# Patient Record
Sex: Male | Born: 1960
Health system: Southern US, Community
[De-identification: ages and names within clinical notes are randomized; demographics above are authoritative.]

## PROBLEM LIST (undated history)

## (undated) DIAGNOSIS — M5030 Other cervical disc degeneration, unspecified cervical region: Secondary | ICD-10-CM

## (undated) DIAGNOSIS — G2581 Restless legs syndrome: Secondary | ICD-10-CM

## (undated) DIAGNOSIS — E785 Hyperlipidemia, unspecified: Secondary | ICD-10-CM

## (undated) DIAGNOSIS — L719 Rosacea, unspecified: Secondary | ICD-10-CM

## (undated) DIAGNOSIS — G35 Multiple sclerosis: Secondary | ICD-10-CM

## (undated) HISTORY — DX: Rosacea, unspecified: L71.9

## (undated) HISTORY — DX: Hyperlipidemia, unspecified: E78.5

## (undated) HISTORY — DX: Multiple sclerosis: G35

## (undated) HISTORY — DX: Other cervical disc degeneration, unspecified cervical region: M50.30

## (undated) HISTORY — DX: Restless legs syndrome: G25.81

## (undated) HISTORY — PX: NO PAST SURGERIES: SHX2092

---

## 2014-11-17 ENCOUNTER — Telehealth: Payer: Self-pay

## 2014-11-17 NOTE — Telephone Encounter (Signed)
Rec'd from [facility] forward 6 pages to Historical Provider

## 2014-11-19 ENCOUNTER — Telehealth: Payer: Self-pay | Admitting: Neurology

## 2014-12-30 ENCOUNTER — Encounter: Payer: Self-pay | Admitting: *Deleted

## 2015-01-12 ENCOUNTER — Telehealth: Payer: Self-pay

## 2015-01-12 NOTE — Telephone Encounter (Signed)
LM for patient to call me regarding notes from previous Neurologist. Thanks

## 2015-01-17 NOTE — Telephone Encounter (Signed)
Number disconnected.

## 2015-02-18 ENCOUNTER — Telehealth: Payer: Self-pay

## 2015-02-18 NOTE — Telephone Encounter (Signed)
Encounter to update history

## 2015-02-21 ENCOUNTER — Other Ambulatory Visit: Payer: BLUE CROSS/BLUE SHIELD

## 2015-02-21 ENCOUNTER — Ambulatory Visit (INDEPENDENT_AMBULATORY_CARE_PROVIDER_SITE_OTHER): Payer: BLUE CROSS/BLUE SHIELD | Admitting: Neurology

## 2015-02-21 ENCOUNTER — Encounter: Payer: Self-pay | Admitting: Neurology

## 2015-02-21 ENCOUNTER — Other Ambulatory Visit: Payer: Self-pay

## 2015-02-21 VITALS — BP 110/64 | HR 66 | Wt 170.2 lb

## 2015-02-21 DIAGNOSIS — G35 Multiple sclerosis: Secondary | ICD-10-CM

## 2015-02-21 DIAGNOSIS — G43109 Migraine with aura, not intractable, without status migrainosus: Secondary | ICD-10-CM

## 2015-02-21 LAB — VITAMIN D 25 HYDROXY (VIT D DEFICIENCY, FRACTURES): VITD: 59.94 ng/mL (ref 30.00–100.00)

## 2015-02-21 MED ORDER — RIZATRIPTAN BENZOATE 10 MG PO TBDP: ORAL_TABLET | ORAL | Status: DC

## 2015-02-21 NOTE — Patient Instructions (Signed)
1.  Take rizatriptan (Maxalt) 10mg  at earliest onset of headache.  May repeat once in 2 hours if needed.  Do not exceed 2 tablets in 24 hours.  If ineffective, take first dose of rizatriptan with naproxen 500mg .   2.  Will get MRI of brain, cervical and thoracic spine with and without contrast 3.  Follow up in 3 months.

## 2015-02-21 NOTE — Progress Notes (Signed)
Chart sent.  

## 2015-02-21 NOTE — Addendum Note (Signed)
Addended by: Sheilah Mins A on: 02/21/2015 03:44 PM   Modules accepted: Orders

## 2015-02-21 NOTE — Progress Notes (Signed)
NEUROLOGY CONSULTATION NOTE  Christopher Vance MRN: [id number] DOB: 04/17/1961  Referring provider: Prudy Feeler, PA Primary care provider: Prudy Feeler, PA  Reason for consult:  MS  HISTORY OF PRESENT ILLNESS: Christopher Vance is a 54 year old right-handed male with depression and lumbar degenerative disc disease who presents for multiple sclerosis.  History obtained by patient and PCP notes.  MRI report from 2012 reviewed.  He was diagnosed with MS in 1998 after experiencing left sided numbness and paresthesias, fatigue and back pain.  MRI of the brain was abnormal.  CSF results of lumbar puncture were inconclusive.  He was previously on interferons, such as Betaseron and Avonex, as well as possibly Copaxone.  However, he did not tolerate any of them as they caused flu-like symptoms.  He has not been on a disease modifying agent for several years.  He has residual fatigue and left sided numbness.  On occasions, he would take prednisone when these symptoms would worsen.  Sometimes, he reports a shooting pain down the back of his neck, consistent with Lhermitte's sign.  On one occasion, he reported episode of left eye vision loss.  A few months ago, he had worsening of symptoms, as well as increased migraines, and was prescribed 25 days of prednisone, which helped.  He takes gabapentin  three times daily if needed for paresthesias.  He takes ropinirole for restless leg.  Brain MRI report from 12/20/10 showed multiple nonspecific non-enhancing hyperintense foci in the cerebral white matter.  There is no family history of MS.  He also has history of migraine.  They begin in back of head and travel to behind both eyes.  It is a squeezing pain.  It is associated with nausea, photophobia, phonophobia and visual disturbance (squiggly lines).  It lasts 2 days and occurs twice a month.  He takes Exedrin.  Imitrex has been ineffective.  PAST MEDICAL HISTORY: Past Medical History  Diagnosis Date    . Multiple sclerosis (HCC)   . Acne rosacea   . DDD (degenerative disc disease), cervical     PAST SURGICAL HISTORY: No past surgical history on file.  MEDICATIONS: Current Outpatient Prescriptions on File Prior to Visit  Medication Sig Dispense Refill  . clonazePAM (KLONOPIN) 1 MG tablet Take 1 mg by mouth 3 (three) times daily as needed for anxiety.    Marland Kitchen DOXYCYCLINE HYCLATE PO Take 100 mg by mouth every 12 (twelve) hours.    Marland Kitchen ibuprofen (ADVIL,MOTRIN) 800 MG tablet Take 800 mg by mouth every 8 (eight) hours as needed.    . lubiprostone (AMITIZA) 8 MCG capsule Take 8 mcg by mouth 2 (two) times daily with a meal.    . omega-3 acid ethyl esters (LOVAZA) 1 G capsule Take 2 g by mouth 2 (two) times daily.    Marland Kitchen rOPINIRole (REQUIP) 2 MG tablet Take 2 mg by mouth at bedtime.     No current facility-administered medications on file prior to visit.    ALLERGIES: Allergies not on file  FAMILY HISTORY: Family History  Problem Relation Age of Onset  . Hypertension Mother     SOCIAL HISTORY: Social History   Social History  . Marital Status: Married    Spouse Name: N/A  . Number of Children: N/A  . Years of Education: N/A   Occupational History  . Not on file.   Social History Main Topics  . Smoking status: Never Smoker   . Smokeless tobacco: Not on file  . Alcohol Use: No  .  Drug Use: Not on file  . Sexual Activity: Not on file   Other Topics Concern  . Not on file   Social History Narrative   Pt lives with his daughter, one story home. Completed 12th grade.     REVIEW OF SYSTEMS: Constitutional: No fevers, chills, or sweats, no generalized fatigue, change in appetite Eyes: No visual changes, double vision, eye pain Ear, nose and throat: No hearing loss, ear pain, nasal congestion, sore throat Cardiovascular: No chest pain, palpitations Respiratory:  No shortness of breath at rest or with exertion, wheezes GastrointestinaI: No nausea, vomiting, diarrhea, abdominal  pain, fecal incontinence Genitourinary:  No dysuria, urinary retention or frequency Musculoskeletal:  No neck pain, back pain Integumentary: No rash, pruritus, skin lesions Neurological: as above Psychiatric: No depression, insomnia, anxiety Endocrine: No palpitations, fatigue, diaphoresis, mood swings, change in appetite, change in weight, increased thirst Hematologic/Lymphatic:  No anemia, purpura, petechiae. Allergic/Immunologic: no itchy/runny eyes, nasal congestion, recent allergic reactions, rashes  PHYSICAL EXAM: Filed Vitals:   02/21/15 1439  BP: 110/64  Pulse: 66   General: No acute distress.  Patient appears well-groomed.  Head:  Normocephalic/atraumatic Eyes:  fundi unremarkable, without vessel changes, exudates, hemorrhages or papilledema. Neck: supple, no paraspinal tenderness, full range of motion Back: No paraspinal tenderness Heart: regular rate and rhythm Lungs: Clear to auscultation bilaterally. Vascular: No carotid bruits. Neurological Exam: Mental status: alert and oriented to person, place, and time, recent and remote memory intact, fund of knowledge intact, attention and concentration intact, speech fluent and not dysarthric, language intact. Cranial nerves: CN I: not tested CN II: pupils equal, round and reactive to light, visual fields intact, fundi unremarkable, without vessel changes, exudates, hemorrhages or papilledema. CN III, IV, VI:  full range of motion, no nystagmus, no ptosis CN V: facial sensation intact CN VII: upper and lower face symmetric CN VIII: hearing intact CN IX, X: gag intact, uvula midline CN XI: sternocleidomastoid and trapezius muscles intact CN XII: tongue midline Bulk & Tone: normal, no fasciculations. Motor:  5/5 throughout  Sensation:  Pinprick and vibration sensation intact. Deep Tendon Reflexes:  2+ throughout, toes downgoing.  Finger to nose testing:  Without dysmetria.  Heel to shin:  Without dysmetria.  Gait:  Normal  station and stride.  Able to turn and tandem walk. Timed 25 foot walk 4.90 seconds.  Romberg negative.  IMPRESSION: Multiple sclerosis Migraine with aura  PLAN: 1.  MRI of brain, cervical and thoracic spine with and without contrast 2.  Maxalt 10mg  with or without naproxen 500mg  for abortive therapy for migraine 3.  Check vitamin D level 4.  Follow up in 3 months.  Thank you for allowing me to take part in the care of this patient.  Shon Millet, DO  CC: Prudy Feeler, PA

## 2015-02-22 ENCOUNTER — Telehealth: Payer: Self-pay

## 2015-02-22 NOTE — Telephone Encounter (Signed)
Prior authorization for Rizatriptan ODT 10 mg tablets was started via [email or web address]. Key - [id number]

## 2015-02-22 NOTE — Telephone Encounter (Signed)
-----   Message from Adam R Jaffe, DO sent at 02/22/2015  7:09 AM EDT ----- Vitamin D level is okay. 

## 2015-02-22 NOTE — Telephone Encounter (Signed)
Left message on patients vm

## 2015-02-22 NOTE — Telephone Encounter (Signed)
-----   Message from Drema Dallas, DO sent at 02/22/2015  7:09 AM EDT ----- Vitamin D level is okay.

## 2015-02-24 NOTE — Telephone Encounter (Signed)
Received fax from [facility] stating medication did not need a PA for 9 tablets a month. States pharmacy was trying to run rx through at higher amount (possibly ran through system twice on accident). No further action needed.

## 2015-03-03 ENCOUNTER — Ambulatory Visit
Admission: RE | Admit: 2015-03-03 | Discharge: 2015-03-03 | Disposition: A | Discharge status: Home / Self Care | Payer: BLUE CROSS/BLUE SHIELD | Source: Ambulatory Visit | Attending: Neurology | Admitting: Neurology

## 2015-03-03 DIAGNOSIS — G35 Multiple sclerosis: Secondary | ICD-10-CM

## 2015-03-03 DIAGNOSIS — G43109 Migraine with aura, not intractable, without status migrainosus: Secondary | ICD-10-CM

## 2015-03-04 ENCOUNTER — Telehealth: Payer: Self-pay

## 2015-03-04 NOTE — Telephone Encounter (Signed)
Left message for pt to return call.

## 2015-03-04 NOTE — Telephone Encounter (Signed)
-----   Message from Drema Dallas, DO sent at 03/04/2015  7:10 AM EDT ----- MRI is stable.  It shows no active disease in the brain.  The spinal cord looks normal.

## 2015-03-04 NOTE — Telephone Encounter (Signed)
Spoke to patient. Message relayed.

## 2015-03-14 NOTE — Telephone Encounter (Signed)
Error

## 2015-06-03 ENCOUNTER — Encounter: Payer: Self-pay | Admitting: Neurology

## 2015-06-03 ENCOUNTER — Ambulatory Visit (INDEPENDENT_AMBULATORY_CARE_PROVIDER_SITE_OTHER): Payer: BLUE CROSS/BLUE SHIELD | Admitting: Neurology

## 2015-06-03 ENCOUNTER — Other Ambulatory Visit (INDEPENDENT_AMBULATORY_CARE_PROVIDER_SITE_OTHER): Payer: BLUE CROSS/BLUE SHIELD

## 2015-06-03 VITALS — BP 108/60 | HR 93 | Ht 70.0 in | Wt 170.0 lb

## 2015-06-03 DIAGNOSIS — R9082 White matter disease, unspecified: Secondary | ICD-10-CM | POA: Insufficient documentation

## 2015-06-03 DIAGNOSIS — M62838 Other muscle spasm: Secondary | ICD-10-CM | POA: Diagnosis not present

## 2015-06-03 DIAGNOSIS — R930 Abnormal findings on diagnostic imaging of skull and head, not elsewhere classified: Secondary | ICD-10-CM | POA: Diagnosis not present

## 2015-06-03 DIAGNOSIS — G43109 Migraine with aura, not intractable, without status migrainosus: Secondary | ICD-10-CM | POA: Diagnosis not present

## 2015-06-03 LAB — VITAMIN B12: Vitamin B-12: 417 pg/mL (ref 211–911)

## 2015-06-03 LAB — SEDIMENTATION RATE: Sed Rate: 16 mm/hr (ref 0–22)

## 2015-06-03 LAB — CK: CK TOTAL: 96 U/L (ref 7–232)

## 2015-06-03 LAB — RHEUMATOID FACTOR

## 2015-06-03 NOTE — Patient Instructions (Signed)
1.  Continue rizatriptan and naproxen for migraines 2.  If you would like a medication for muscle spasms, please call 3.  Will check CK, ANA, Sed Rate, RF, Sjogren's antibodies, ANCA, B12 4.  Repeat MRI of brain with and without contrast in 6 months and follow up soon after

## 2015-06-03 NOTE — Progress Notes (Signed)
Chart forwarded.  

## 2015-06-03 NOTE — Progress Notes (Signed)
NEUROLOGY FOLLOW UP OFFICE NOTE  Christopher Vance [id number]  HISTORY OF PRESENT ILLNESS: Christopher Vance is a 55 year old right-handed male with depression and lumbar degenerative disc disease who follows up for multiple sclerosis and migraine.  Labs and imaging of neuro-axis reviewed.  UPDATE: MRI of brain with and without contrast performed on 03/03/15 showed scattered subcortical non-enhancing T2 hyperintensities bilaterally.  MRI of the cervical and thoracic spine demonstrated no cord lesions.  Vitamin D level was 59.94.    For migraines, he takes Maxalt '10mg'$  with naproxen, which is effective. He reports muscle spasms.  HISTORY: He was diagnosed with MS in 1998 after experiencing left sided numbness and paresthesias, fatigue and back pain.  MRI of the brain was abnormal.  CSF results of lumbar puncture were inconclusive.  He was previously on interferons, such as Betaseron and Avonex, as well as possibly Copaxone.  However, he did not tolerate any of them as they caused flu-like symptoms.  He has not been on a disease modifying agent for several years.  He has residual fatigue and left sided numbness.  On occasions, he would take prednisone when these symptoms would worsen.  Sometimes, he reports a shooting pain down the back of his neck, consistent with Lhermitte's sign.  On one occasion, he reported episode of left eye vision loss.  A few months ago, he had worsening of symptoms, as well as increased migraines, and was prescribed 25 days of prednisone, which helped.  He takes gabapentin '100mg'$  three times daily if needed for paresthesias.  He takes ropinirole for restless leg.  Brain MRI report from 12/20/10 showed multiple nonspecific non-enhancing hyperintense foci in the cerebral white matter.  There is no family history of MS.  He also has history of migraine.  They begin in back of head and travel to behind both eyes.  It is a squeezing pain.  It is associated with nausea,  photophobia, phonophobia and visual disturbance (squiggly lines).  It lasts 2 days and occurs twice a month.  Exedrin somewhat effective.  Imitrex has been ineffective.  PAST MEDICAL HISTORY: Past Medical History  Diagnosis Date  . Multiple sclerosis ([place])   . Acne rosacea   . DDD (degenerative disc disease), cervical     MEDICATIONS: Current Outpatient Prescriptions on File Prior to Visit  Medication Sig Dispense Refill  . citalopram (CELEXA) 10 MG tablet   1  . clonazePAM (KLONOPIN) 1 MG tablet Take 1 mg by mouth 3 (three) times daily as needed for anxiety.    Marland Kitchen DOXYCYCLINE HYCLATE PO Take 100 mg by mouth every 12 (twelve) hours.    . gabapentin (NEURONTIN) 100 MG capsule   2  . ibuprofen (ADVIL,MOTRIN) 800 MG tablet Take 800 mg by mouth every 8 (eight) hours as needed.    Marland Kitchen LINZESS 290 MCG CAPS capsule   0  . omega-3 acid ethyl esters (LOVAZA) 1 G capsule Take 2 g by mouth 2 (two) times daily.    . rizatriptan (MAXALT-MLT) 10 MG disintegrating tablet Take 1tab at earliest onset of headache.  May repeat once in 2 hours if needed.  Do not exceed 2 tabs in 24 hours. 9 tablet 3  . rOPINIRole (REQUIP) 2 MG tablet Take 2 mg by mouth at bedtime.     No current facility-administered medications on file prior to visit.    ALLERGIES: No Known Allergies  FAMILY HISTORY: Family History  Problem Relation Age of Onset  . Hypertension Mother  SOCIAL HISTORY: Social History   Social History  . Marital Status: Married    Spouse Name: N/A  . Number of Children: N/A  . Years of Education: N/A   Occupational History  . Not on file.   Social History Main Topics  . Smoking status: Never Smoker   . Smokeless tobacco: Not on file  . Alcohol Use: No  . Drug Use: Not on file  . Sexual Activity: Not on file   Other Topics Concern  . Not on file   Social History Narrative   Pt lives with his daughter, one story home. Completed 12th grade.     REVIEW OF SYSTEMS: Constitutional:  No fevers, chills, or sweats, no generalized fatigue, change in appetite Eyes: No visual changes, double vision, eye pain Ear, nose and throat: No hearing loss, ear pain, nasal congestion, sore throat Cardiovascular: No chest pain, palpitations Respiratory:  No shortness of breath at rest or with exertion, wheezes GastrointestinaI: No nausea, vomiting, diarrhea, abdominal pain, fecal incontinence Genitourinary:  No dysuria, urinary retention or frequency Musculoskeletal:  No neck pain, back pain Integumentary: No rash, pruritus, skin lesions Neurological: as above Psychiatric: No depression, insomnia, anxiety Endocrine: No palpitations, fatigue, diaphoresis, mood swings, change in appetite, change in weight, increased thirst Hematologic/Lymphatic:  No anemia, purpura, petechiae. Allergic/Immunologic: no itchy/runny eyes, nasal congestion, recent allergic reactions, rashes  PHYSICAL EXAM: Filed Vitals:   06/03/15 0749  BP: 108/60  Pulse: 93   General: No acute distress.  Patient appears well-groomed.  normal body habitus. Head:  Normocephalic/atraumatic Eyes:  Fundoscopic exam unremarkable without vessel changes, exudates, hemorrhages or papilledema. Neck: supple, no paraspinal tenderness, full range of motion Heart:  Regular rate and rhythm Lungs:  Clear to auscultation bilaterally Back: No paraspinal tenderness Neurological Exam: alert and oriented to person, place, and time. Attention span and concentration intact, recent and remote memory intact, fund of knowledge intact.  Speech fluent and not dysarthric, language intact.  CN II-XII intact. Fundoscopic exam unremarkable without vessel changes, exudates, hemorrhages or papilledema.  Bulk and tone normal, muscle strength 5/5 throughout.  Sensation to vibration intact.  Reports mildly decreased pinprick sensation to left foot.  Deep tendon reflexes 2+ throughout, toes downgoing.  Finger to nose and heel to shin testing intact.  Gait  normal, Romberg negative.  IMPRESSION: Abnormal white matter on MRI of brain.  Nonspecific.  Without prior MRI imaging, I cannot assess if there is any progression, but it is not abundant.  At this point, he really doesn't fit criteria for MS. Migraine with aura Muscle spasms  PLAN: 1.  Since he has clinically been stable for several years, and diagnosis has not yet met criteria, we will hold off on starting a disease modifying agent and continue to monitor.  We will repeat MRI of brain with and without contrast in 6 months. 2.  Recommended baclofen for spasms, but he would like to hold off for now.  Will check CK 3.  Will check ANA, Sed Rate, RF, Sjogren's antibodies, ANCA, B12 4.  Maxalt '10mg'$  and naproxen for migraine abortive treatment 5.  Follow up in 6 months.  Metta Clines, DO  CC:  Donaciano Eva, PA-C

## 2015-06-06 ENCOUNTER — Telehealth: Payer: Self-pay

## 2015-06-06 LAB — ANA: Anti Nuclear Antibody(ANA): NEGATIVE

## 2015-06-06 LAB — ANCA SCREEN W REFLEX TITER: ANCA SCREEN: NEGATIVE

## 2015-06-06 LAB — SJOGREN'S SYNDROME ANTIBODS(SSA + SSB)
SSA (RO) (ENA) ANTIBODY, IGG: NEGATIVE
SSB (LA) (ENA) ANTIBODY, IGG: NEGATIVE

## 2015-06-06 NOTE — Telephone Encounter (Signed)
-----   Message from Drema Dallas, DO sent at 06/06/2015  1:26 PM EST ----- All labs are normal

## 2015-06-06 NOTE — Telephone Encounter (Signed)
Results left on machine.

## 2015-06-08 ENCOUNTER — Encounter: Payer: Self-pay | Admitting: Neurology

## 2015-06-10 ENCOUNTER — Encounter: Payer: Self-pay | Admitting: Physician Assistant

## 2015-07-22 ENCOUNTER — Telehealth: Payer: Self-pay | Admitting: Neurology

## 2015-07-22 NOTE — Telephone Encounter (Signed)
Patient called wanting to know if you all received his MRI results that were coming from [facility]. His name is Christopher Vance 2061/01/04. Thank you

## 2015-07-22 NOTE — Telephone Encounter (Signed)
Have not seen. Not in CareEverywhere. Will contact [facility] on Monday.

## 2015-10-14 DIAGNOSIS — H1132 Conjunctival hemorrhage, left eye: Secondary | ICD-10-CM | POA: Diagnosis not present

## 2015-12-01 ENCOUNTER — Telehealth: Payer: Self-pay | Admitting: Neurology

## 2015-12-02 ENCOUNTER — Ambulatory Visit: Payer: BLUE CROSS/BLUE SHIELD | Admitting: Neurology

## 2015-12-19 NOTE — Telephone Encounter (Signed)
Finished

## 2016-05-15 ENCOUNTER — Ambulatory Visit (INDEPENDENT_AMBULATORY_CARE_PROVIDER_SITE_OTHER): Payer: BLUE CROSS/BLUE SHIELD | Admitting: Physician Assistant

## 2016-05-15 ENCOUNTER — Encounter: Payer: Self-pay | Admitting: Physician Assistant

## 2016-05-15 VITALS — BP 114/73 | HR 54 | Temp 97.2°F | Ht 70.0 in | Wt 145.6 lb

## 2016-05-15 DIAGNOSIS — G35 Multiple sclerosis: Secondary | ICD-10-CM | POA: Diagnosis not present

## 2016-05-15 DIAGNOSIS — G43109 Migraine with aura, not intractable, without status migrainosus: Secondary | ICD-10-CM | POA: Diagnosis not present

## 2016-05-15 MED ORDER — ROPINIROLE HCL 2 MG PO TABS: 2.0000 mg | ORAL_TABLET | Freq: Every day | ORAL | 3 refills | Status: DC

## 2016-05-15 MED ORDER — RIZATRIPTAN BENZOATE 10 MG PO TBDP: ORAL_TABLET | ORAL | 3 refills | Status: DC

## 2016-05-15 NOTE — Patient Instructions (Signed)
Multiple Sclerosis °Multiple sclerosis (MS) is a disease of the central nervous system. It leads to the loss of the insulating covering of the nerves (myelin sheath) of your brain. When this happens, brain signals do not get sent properly or may not get sent at all. The age of onset of MS varies. °What are the causes? °The cause of MS is unknown. However, it is more common in the [place] than in the [place]. °What increases the risk? °There is a higher number of women with MS than men. MS is not an illness that is passed down to you from your family members (inherited). However, your risk of MS is higher if you have a relative with MS. °What are the signs or symptoms? °The symptoms of MS occur in episodes or attacks. These attacks may last weeks to months. There may be long periods of almost no symptoms between attacks. The symptoms of MS vary. This is because of the many different ways it affects the central nervous system. The main symptoms of MS include: °· Vision problems and eye pain. °· Numbness. °· Weakness. °· Inability to move your arms, hands, feet, or legs (paralysis). °· Balance problems. °· Tremors. °How is this diagnosed? °Your health care provider can diagnose MS with the help of imaging exams and lab tests. These may include specialized X-ray exams and spinal fluid tests. The best imaging exam to confirm a diagnosis of MS is an MRI. °How is this treated? °There is no known cure for MS, but there are medicines that can decrease the number and frequency of attacks. Steroids are often used for short-term relief. Physical and occupational therapy may also help. There are also many new alternative or complementary treatments available to help control the symptoms of MS. Ask your health care provider if any of these other options are right for you. °Follow these instructions at home: °· Take medicines as directed by your health care provider. °· Exercise as directed by your  health care provider. °Contact a health care provider if: °You begin to feel depressed. °Get help right away if: °· You develop paralysis. °· You have problems with bladder, bowel, or sexual function. °· You develop mental changes, such as forgetfulness or mood swings. °· You have a period of uncontrolled movements (seizure). °This information is not intended to replace advice given to you by your health care provider. Make sure you discuss any questions you have with your health care provider. °Document Released: 04/13/2000 Document Revised: 09/22/2015 Document Reviewed: 12/22/2012 °Elsevier Interactive Patient Education © 2017 [employer] ° °

## 2016-05-17 NOTE — Progress Notes (Signed)
BP 114/73   Pulse (!) 54   Temp 97.2 F (36.2 C) (Oral)   Ht 5\' 10"  (1.778 m)   Wt 145 lb 9.6 oz (66 kg)   BMI 20.89 kg/m    Subjective:    Patient ID: Christopher Vance, male    DOB: 02/12/1961, 56 y.o.   MRN: [id number]  HPI: Christopher Vance is a 56 y.o. male presenting on 05/15/2016 for Establish Care (new pt to practice - est w/ Prudy Feeler)  Patient here to be established as new patient at [facility].  This patient is known to me from [facility]. This patient comes in for periodic recheck on medications and conditions. All medications are reviewed today. There are no reports of any problems with the medications. All of the medical conditions are reviewed and updated.  Lab work is reviewed and will be ordered as medically necessary. There are no new problems reported with today's visit.   Past Medical History:  Diagnosis Date  . Acne rosacea   . DDD (degenerative disc disease), cervical   . Hyperlipidemia   . Multiple sclerosis (HCC)   . Multiple sclerosis (HCC)   . RLS (restless legs syndrome)    Relevant past medical, surgical, family and social history reviewed and updated as indicated. Interim medical history since our last visit reviewed. Allergies and medications reviewed and updated. DATA REVIEWED: CHART IN EPIC  Social History   Social History  . Marital status: Widowed    Spouse name: N/A  . Number of children: N/A  . Years of education: N/A   Occupational History  . Not on file.   Social History Main Topics  . Smoking status: Never Smoker  . Smokeless tobacco: Never Used  . Alcohol use No  . Drug use: No  . Sexual activity: Not on file   Other Topics Concern  . Not on file   Social History Narrative   Pt lives with his daughter, one story home. Completed 12th grade.     History reviewed. No pertinent surgical history.  Family History  Problem Relation Age of Onset  . Hypertension Mother     Review of  Systems  Constitutional: Negative.  Negative for appetite change and fatigue.  HENT: Negative.   Eyes: Negative.  Negative for pain and visual disturbance.  Respiratory: Negative.  Negative for cough, chest tightness, shortness of breath and wheezing.   Cardiovascular: Negative.  Negative for chest pain, palpitations and leg swelling.  Gastrointestinal: Negative.  Negative for abdominal pain, diarrhea, nausea and vomiting.  Endocrine: Negative.   Genitourinary: Negative.   Musculoskeletal: Negative.   Skin: Negative.  Negative for color change and rash.  Neurological: Negative.  Negative for weakness, numbness and headaches.  Psychiatric/Behavioral: Negative.     Allergies as of 05/15/2016      Reactions   Levofloxacin Nausea And Vomiting      Medication List       Accurate as of 05/15/16 11:59 PM. Always use your most recent med list.          clonazePAM 1 MG tablet Commonly known as:  KLONOPIN Take 1 mg by mouth 3 (three) times daily as needed for anxiety.   LINZESS 290 MCG Caps capsule Generic drug:  linaclotide   rizatriptan 10 MG disintegrating tablet Commonly known as:  MAXALT-MLT Take 1tab at earliest onset of headache.  May repeat once in 2 hours if needed.  Do not exceed 2 tabs in 24  hours.   rOPINIRole 2 MG tablet Commonly known as:  REQUIP Take 1 tablet (2 mg total) by mouth at bedtime.          Objective:    BP 114/73   Pulse (!) 54   Temp 97.2 F (36.2 C) (Oral)   Ht 5\' 10"  (1.778 m)   Wt 145 lb 9.6 oz (66 kg)   BMI 20.89 kg/m   Allergies  Allergen Reactions  . Levofloxacin Nausea And Vomiting    Wt Readings from Last 3 Encounters:  05/15/16 145 lb 9.6 oz (66 kg)  06/03/15 170 lb (77.1 kg)  03/03/15 165 lb (74.8 kg)    Physical Exam  Constitutional: He appears well-developed and well-nourished.  HENT:  Head: Normocephalic and atraumatic.  Eyes: Conjunctivae and EOM are normal. Pupils are equal, round, and reactive to light.  Neck:  Normal range of motion. Neck supple.  Cardiovascular: Normal rate, regular rhythm and normal heart sounds.   Pulmonary/Chest: Effort normal and breath sounds normal.  Abdominal: Soft. Bowel sounds are normal.  Musculoskeletal: Normal range of motion.  Skin: Skin is warm and dry.    Results for orders placed or performed in visit on 06/03/15  CK  Result Value Ref Range   Total CK 96 7 - 232 U/L  ANA  Result Value Ref Range   Anit Nuclear Antibody(ANA) NEG NEGATIVE  Sedimentation rate  Result Value Ref Range   Sed Rate 16 0 - 22 mm/hr  Rheumatoid factor  Result Value Ref Range   Rhuematoid fact SerPl-aCnc <10 <=14 IU/mL  Sjogren's syndrome antibods(ssa + ssb)  Result Value Ref Range   SSA ([place]) (ENA) Antibody, IgG <1.0 NEG <1.0 NEG AI   SSB ([place]) (ENA) Antibody, IgG <1.0 NEG <1.0 NEG AI  ANCA screen with reflex titer  Result Value Ref Range   ANCA Screen Negative   B12  Result Value Ref Range   Vitamin B-12 417 211 - 911 pg/mL      Assessment & Plan:   1. Relapsing remitting multiple sclerosis (HCC) - rOPINIRole (REQUIP) 2 MG tablet; Take 1 tablet (2 mg total) by mouth at bedtime.  Dispense: 180 tablet; Refill: 3  2. Migraine with aura and without status migrainosus, not intractable - rizatriptan (MAXALT-MLT) 10 MG disintegrating tablet; Take 1tab at earliest onset of headache.  May repeat once in 2 hours if needed.  Do not exceed 2 tabs in 24 hours.  Dispense: 27 tablet; Refill: 3   Continue all other maintenance medications as listed above.  Follow up plan: Return if symptoms worsen or fail to improve.  Educational handout given for multiple sclerosis  Remus Loffler PA-C [facility] [address]  [place], [place] [address] [phone]   05/17/2016, 8:21 PM

## 2016-07-03 IMAGING — MR MR THORACIC SPINE W/O CM
4 of 6 series · 19 of 48 positions shown · non-contrast
Comparison: MRI of the cervical spine from the same day.

CLINICAL DATA: Relapsing remitting multiple sclerosis. New onset of
weakness and migraines.

EXAM:
MRI THORACIC SPINE WITHOUT CONTRAST
TECHNIQUE: Multiplanar, multisequence MR imaging of the thoracic spine was
performed. No intravenous contrast was administered.

[Series 4: T1 · sagittal · 3.0mm · 0.67mm/px · 3 of 15 slices shown]
[im 3/15]
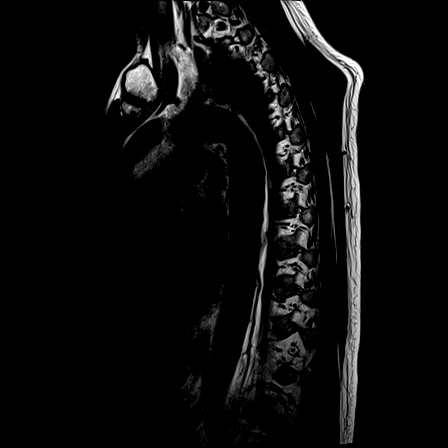
[im 9/15]
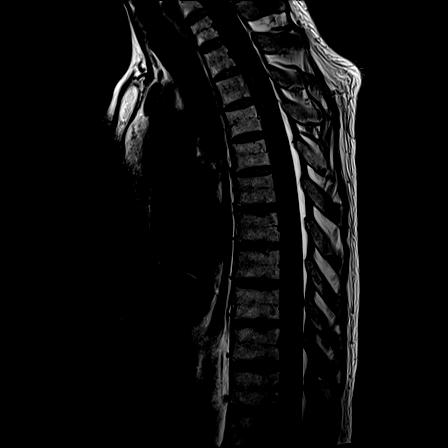
[im 15/15]
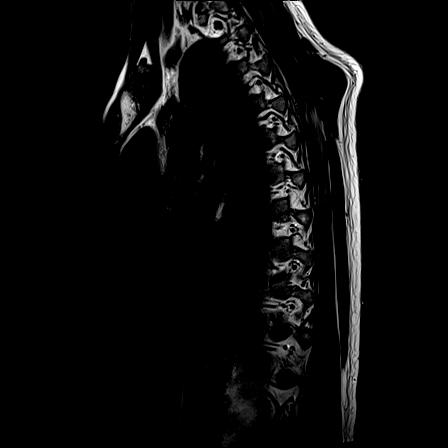

[Series 5: STIR · sagittal · 3.0mm · 0.67mm/px · 3 of 15 slices shown]
[im 3/15]
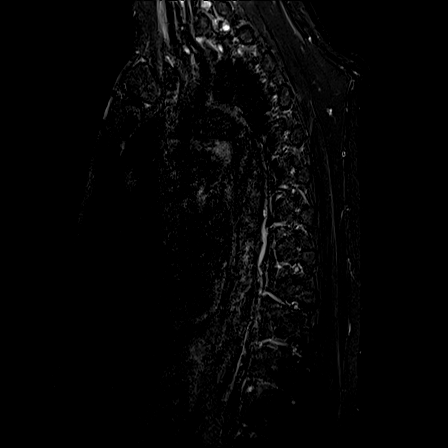
[im 9/15]
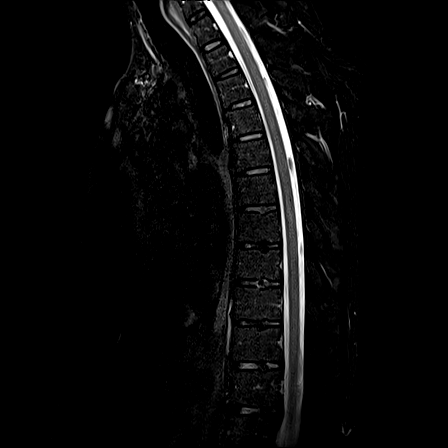
[im 15/15]
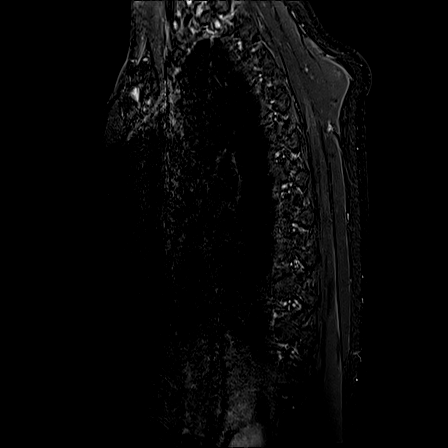

[Series 6: T2 · sagittal · 3.0mm · 0.67mm/px · 6 of 15 slices shown (1 of 2)]
[im 1/15]
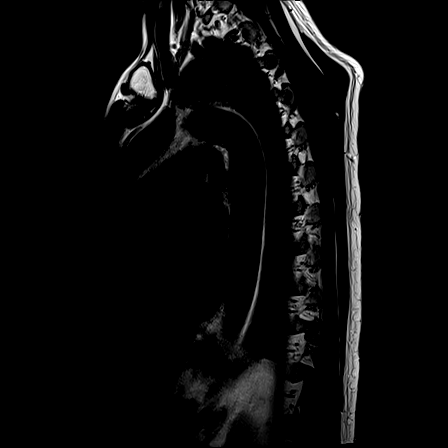
[im 3/15]
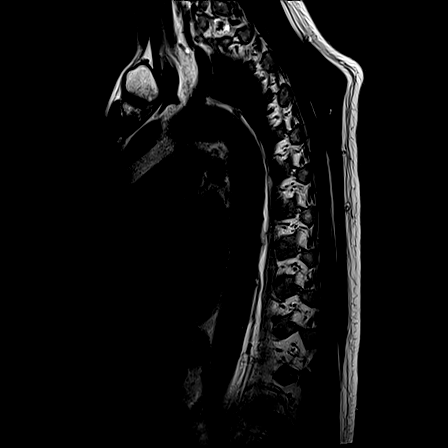
[im 6/15]
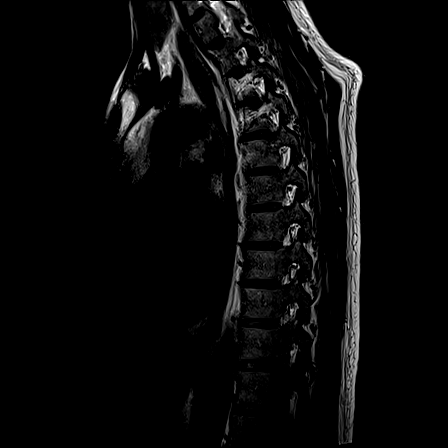
[im 9/15]
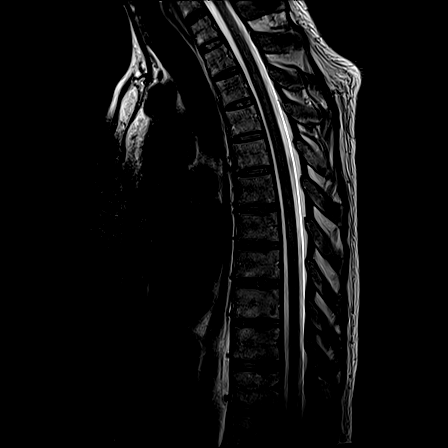
[im 12/15]
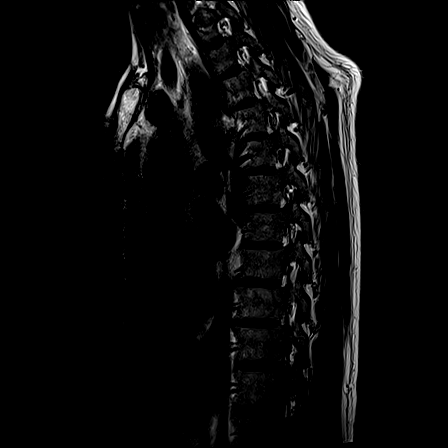
[im 15/15]
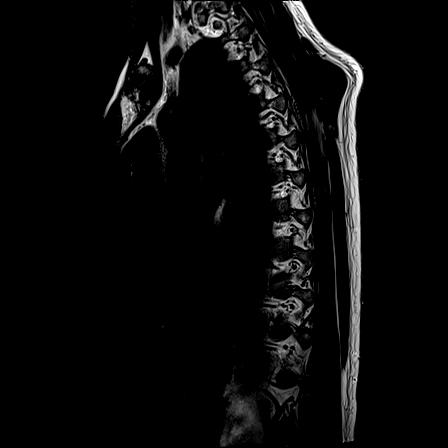

[Series 7: T2 · axial · 4.0mm · 0.28mm/px · z∈[-303,-121]mm · 7 of 33 slices shown (2 of 2)]
[im 1/33]
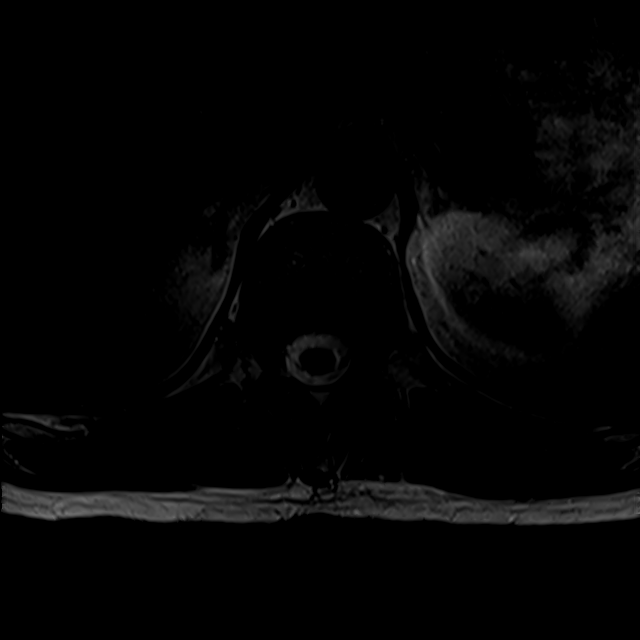
[im 6/33]
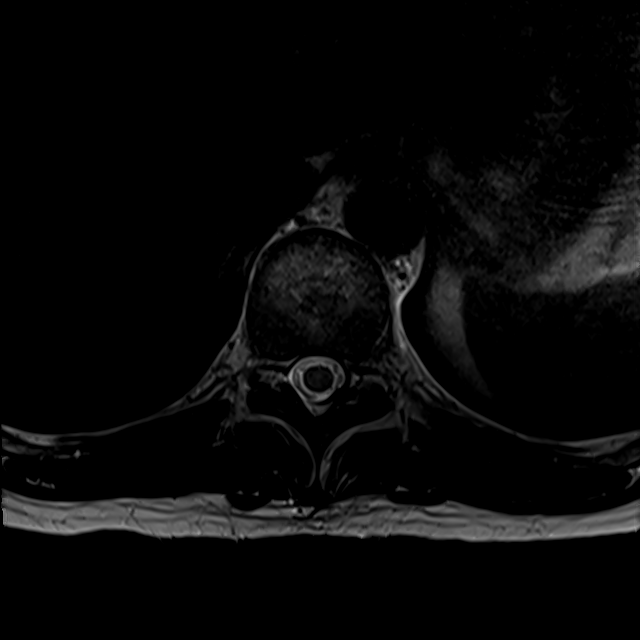
[im 11/33]
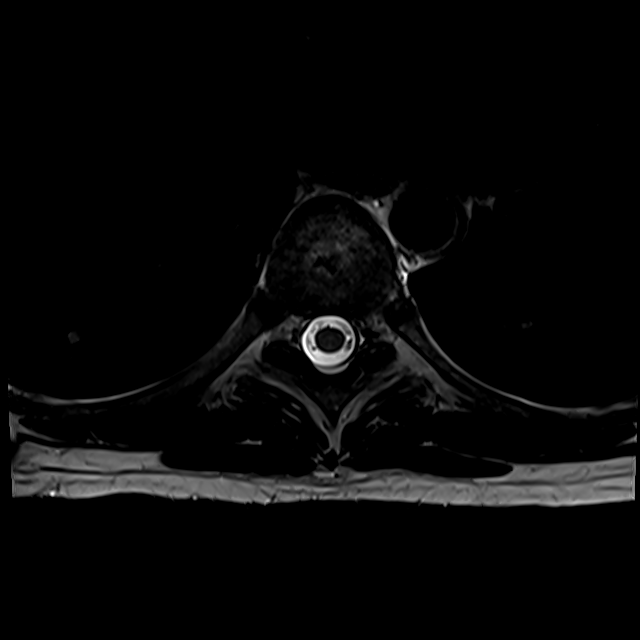
[im 14/33]
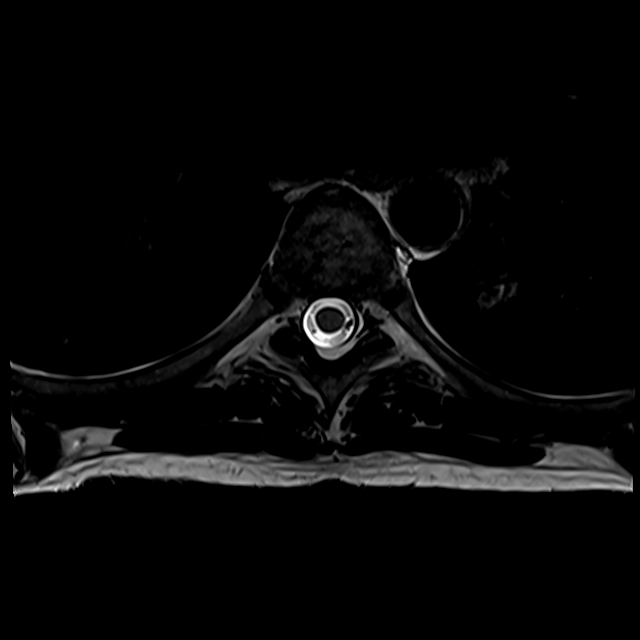
[im 17/33]
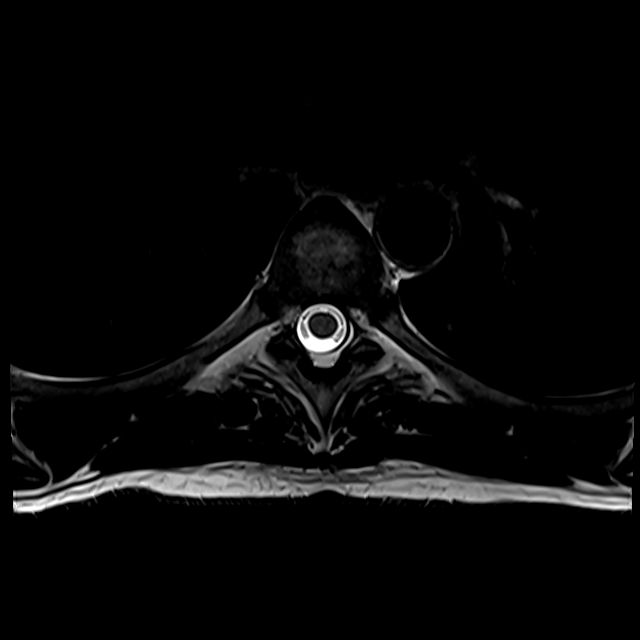
[im 19/33]
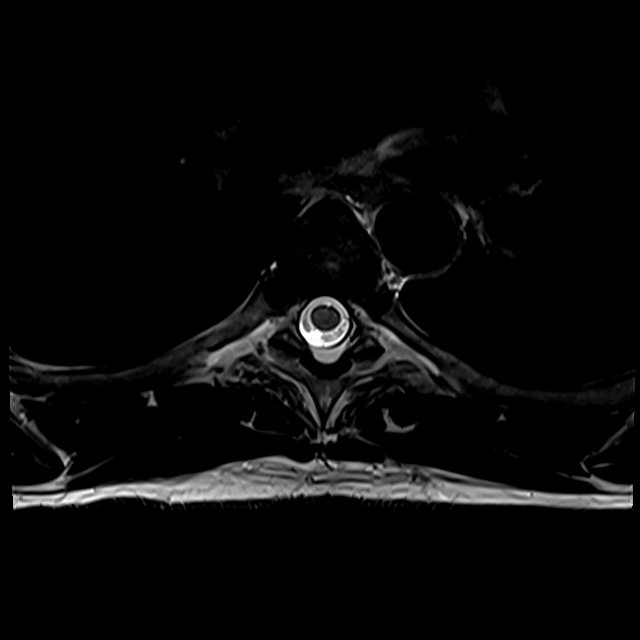
[im 27/33]
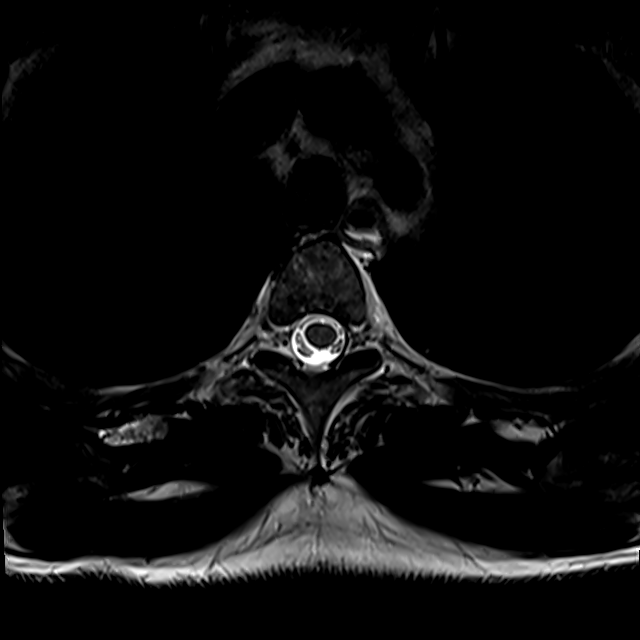

[19 of 48 positions shown; findings below may reference images not displayed]

FINDINGS: Normal signal is present throughout the thoracic spinal cord to the
lowest imaged level T12. The conus medullaris is not visualized.

Schmorl's nodes are evident from T6-7 through T11-12 without
exaggerated kyphosis or significant endplate marrow change.
Vertebral body heights and alignment are maintained.

No significant focal disc protrusion or stenosis is present. The
foramina are patent bilaterally.
IMPRESSION: 1. Normal signal throughout the spinal cord without evidence for
demyelination.
2. Schmorl's nodes at multiple levels without exaggerated kyphosis
or abnormal endplate signal.
3. No significant focal disc protrusion or stenosis.

## 2016-07-13 ENCOUNTER — Other Ambulatory Visit: Payer: Self-pay

## 2016-07-13 MED ORDER — IBUPROFEN 800 MG PO TABS
800.0000 mg | ORAL_TABLET | Freq: Three times a day (TID) | ORAL | 4 refills | Status: DC | PRN
Start: 2016-07-13 — End: 2017-06-01

## 2016-07-24 ENCOUNTER — Ambulatory Visit (INDEPENDENT_AMBULATORY_CARE_PROVIDER_SITE_OTHER): Payer: BLUE CROSS/BLUE SHIELD | Admitting: Family Medicine

## 2016-07-24 ENCOUNTER — Encounter: Payer: Self-pay | Admitting: Family Medicine

## 2016-07-24 VITALS — BP 103/64 | HR 58 | Temp 96.6°F | Ht 70.0 in | Wt 142.8 lb

## 2016-07-24 DIAGNOSIS — Z0000 Encounter for general adult medical examination without abnormal findings: Secondary | ICD-10-CM

## 2016-07-24 NOTE — Patient Instructions (Signed)
Great to meet you!  Come back in a year for an annual physical unless you need [place] sooner.    Health Maintenance, Male A healthy lifestyle and preventive care is important for your health and wellness. Ask your health care provider about what schedule of regular examinations is right for you. What should I know about weight and diet?  Eat a Healthy Diet  Eat plenty of vegetables, fruits, whole grains, low-fat dairy products, and lean protein.  Do not eat a lot of foods high in solid fats, added sugars, or salt. Maintain a Healthy Weight  Regular exercise can help you achieve or maintain a healthy weight. You should:  Do at least 150 minutes of exercise each week. The exercise should increase your heart rate and make you sweat (moderate-intensity exercise).  Do strength-training exercises at least twice a week. Watch Your Levels of Cholesterol and Blood Lipids  Have your blood tested for lipids and cholesterol every 5 years starting at 54 years of age. If you are at high risk for heart disease, you should start having your blood tested when you are 55 years old. You may need to have your cholesterol levels checked more often if:  Your lipid or cholesterol levels are high.  You are older than 55 years of age.  You are at high risk for heart disease. What should I know about cancer screening? Many types of cancers can be detected early and may often be prevented. Lung Cancer  You should be screened every year for lung cancer if:  You are a current smoker who has smoked for at least 30 years.  You are a former smoker who has quit within the past 15 years.  Talk to your health care provider about your screening options, when you should start screening, and how often you should be screened. Colorectal Cancer  Routine colorectal cancer screening usually begins at 55 years of age and should be repeated every 5-10 years until you are 55 years old. You may need to be screened more often  if early forms of precancerous polyps or small growths are found. Your health care provider may recommend screening at an earlier age if you have risk factors for colon cancer.  Your health care provider may recommend using home test kits to check for hidden blood in the stool.  A small camera at the end of a tube can be used to examine your colon (sigmoidoscopy or colonoscopy). This checks for the earliest forms of colorectal cancer. Prostate and Testicular Cancer  Depending on your age and overall health, your health care provider may do certain tests to screen for prostate and testicular cancer.  Talk to your health care provider about any symptoms or concerns you have about testicular or prostate cancer. Skin Cancer  Check your skin from head to toe regularly.  Tell your health care provider about any new moles or changes in moles, especially if:  There is a change in a mole's size, shape, or color.  You have a mole that is larger than a pencil eraser.  Always use sunscreen. Apply sunscreen liberally and repeat throughout the day.  Protect yourself by wearing long sleeves, pants, a wide-brimmed hat, and sunglasses when outside. What should I know about heart disease, diabetes, and high blood pressure?  If you are 5-34 years of age, have your blood pressure checked every 3-5 years. If you are 84 years of age or older, have your blood pressure checked every year. You should  have your blood pressure measured twice-once when you are at a hospital or clinic, and once when you are not at a hospital or clinic. Record the average of the two measurements. To check your blood pressure when you are not at a hospital or clinic, you can use:  An automated blood pressure machine at a pharmacy.  A home blood pressure monitor.  Talk to your health care provider about your target blood pressure.  If you are between 65-60 years old, ask your health care provider if you should take aspirin to  prevent heart disease.  Have regular diabetes screenings by checking your fasting blood sugar level.  If you are at a normal weight and have a low risk for diabetes, have this test once every three years after the age of 62.  If you are overweight and have a high risk for diabetes, consider being tested at a younger age or more often.  A one-time screening for abdominal aortic aneurysm (AAA) by ultrasound is recommended for men aged 65-75 years who are current or former smokers. What should I know about preventing infection? Hepatitis B  If you have a higher risk for hepatitis B, you should be screened for this virus. Talk with your health care provider to find out if you are at risk for hepatitis B infection. Hepatitis C  Blood testing is recommended for:  Everyone born from [birth year] through 1964.  Anyone with known risk factors for hepatitis C. Sexually Transmitted Diseases (STDs)  You should be screened each year for STDs including gonorrhea and chlamydia if:  You are sexually active and are younger than 55 years of age.  You are older than 55 years of age and your health care provider tells you that you are at risk for this type of infection.  Your sexual activity has changed since you were last screened and you are at an increased risk for chlamydia or gonorrhea. Ask your health care provider if you are at risk.  Talk with your health care provider about whether you are at high risk of being infected with HIV. Your health care provider may recommend a prescription medicine to help prevent HIV infection. What else can I do?  Schedule regular health, dental, and eye exams.  Stay current with your vaccines (immunizations).  Do not use any tobacco products, such as cigarettes, chewing tobacco, and e-cigarettes. If you need help quitting, ask your health care provider.  Limit alcohol intake to no more than 2 drinks per day. One drink equals 12 ounces of beer, 5 ounces of wine, or 1  ounces of hard liquor.  Do not use street drugs.  Do not share needles.  Ask your health care provider for help if you need support or information about quitting drugs.  Tell your health care provider if you often feel depressed.  Tell your health care provider if you have ever been abused or do not feel safe at home. This information is not intended to replace advice given to you by your health care provider. Make sure you discuss any questions you have with your health care provider. Document Released: 10/13/2007 Document Revised: 12/14/2015 Document Reviewed: 01/18/2015 Elsevier Interactive Patient Education  2017 [employer].

## 2016-07-24 NOTE — Progress Notes (Signed)
   HPI  Patient presents today here for annual exam  Patient feels well and has no complaints today. He has relapsing remitting MS and cannot remember the last time he had a flare. He works out doing mostly cardio at least 3 days a week. He watches his diet closely.  He is up-to-date on his colonoscopy, he is willing to do an FOBT.  PMH: Smoking status noted ROS: Per HPI  Objective: BP 103/64   Pulse (!) 58   Temp (!) 96.6 F (35.9 C) (Oral)   Ht '5\' 10"'$  (1.778 m)   Wt 142 lb 12.8 oz (64.8 kg)   BMI 20.49 kg/m  Gen: NAD, alert, cooperative with exam HEENT: NCAT, EOMI, PERRL CV: RRR, good S1/S2, no murmur Resp: CTABL, no wheezes, non-labored Abd: SNTND, BS present, no guarding or organomegaly Ext: No edema, warm Neuro: Alert and oriented, 2+ patellar tendon reflexes, strength 5/5 and sensation intact in all 4 extremities  Assessment and plan:  # Annual physical exam Healthcare maintenance discussed, patient is very healthy appearing and has good lifestyle choices. Basic labs, PSA discussed and ordered FOBT given RTC in 1 year for physical unless needed sooner    Orders Placed This Encounter  Procedures  . Fecal occult blood, imunochemical    Standing Status:   Future    Standing Expiration Date:   10/24/2016  . PSA  . CMP14+EGFR  . CBC with Differential/Platelet  . Lipid panel    Laroy Apple, MD [facility] 07/24/2016, 11:31 AM

## 2016-07-25 ENCOUNTER — Other Ambulatory Visit (INDEPENDENT_AMBULATORY_CARE_PROVIDER_SITE_OTHER): Payer: BLUE CROSS/BLUE SHIELD

## 2016-07-25 DIAGNOSIS — Z0000 Encounter for general adult medical examination without abnormal findings: Secondary | ICD-10-CM | POA: Diagnosis not present

## 2016-07-25 LAB — CBC WITH DIFFERENTIAL/PLATELET
BASOS: 1 %
Basophils Absolute: 0 10*3/uL (ref 0.0–0.2)
EOS (ABSOLUTE): 0.1 10*3/uL (ref 0.0–0.4)
EOS: 3 %
HEMATOCRIT: 37.6 % (ref 37.5–51.0)
HEMOGLOBIN: 13.4 g/dL (ref 13.0–17.7)
IMMATURE GRANS (ABS): 0 10*3/uL (ref 0.0–0.1)
IMMATURE GRANULOCYTES: 0 %
LYMPHS: 39 %
Lymphocytes Absolute: 1.4 10*3/uL (ref 0.7–3.1)
MCH: 32.1 pg (ref 26.6–33.0)
MCHC: 35.6 g/dL (ref 31.5–35.7)
MCV: 90 fL (ref 79–97)
MONOCYTES: 15 %
Monocytes Absolute: 0.5 10*3/uL (ref 0.1–0.9)
NEUTROS ABS: 1.5 10*3/uL (ref 1.4–7.0)
NEUTROS PCT: 42 %
PLATELETS: 214 10*3/uL (ref 150–379)
RBC: 4.17 x10E6/uL (ref 4.14–5.80)
RDW: 13.2 % (ref 12.3–15.4)
WBC: 3.6 10*3/uL (ref 3.4–10.8)

## 2016-07-25 LAB — CMP14+EGFR
A/G RATIO: 1.6 (ref 1.2–2.2)
ALT: 13 IU/L (ref 0–44)
AST: 20 IU/L (ref 0–40)
Albumin: 4.2 g/dL (ref 3.5–5.5)
Alkaline Phosphatase: 42 IU/L (ref 39–117)
BUN/Creatinine Ratio: 19 (ref 9–20)
BUN: 18 mg/dL (ref 6–24)
Bilirubin Total: 0.4 mg/dL (ref 0.0–1.2)
CO2: 23 mmol/L (ref 18–29)
Calcium: 9.3 mg/dL (ref 8.7–10.2)
Chloride: 102 mmol/L (ref 96–106)
Creatinine, Ser: 0.93 mg/dL (ref 0.76–1.27)
GFR, EST AFRICAN AMERICAN: 106 mL/min/{1.73_m2} (ref >59)
GFR, EST NON AFRICAN AMERICAN: 92 mL/min/{1.73_m2} (ref >59)
Globulin, Total: 2.6 g/dL (ref 1.5–4.5)
Glucose: 95 mg/dL (ref 65–99)
POTASSIUM: 4.6 mmol/L (ref 3.5–5.2)
Sodium: 141 mmol/L (ref 134–144)
Total Protein: 6.8 g/dL (ref 6.0–8.5)

## 2016-07-25 LAB — LIPID PANEL
Chol/HDL Ratio: 3.3 ratio units (ref 0.0–5.0)
Cholesterol, Total: 182 mg/dL (ref 100–199)
HDL: 55 mg/dL (ref >39)
LDL CALC: 117 mg/dL — AB (ref 0–99)
Triglycerides: 48 mg/dL (ref 0–149)
VLDL CHOLESTEROL CAL: 10 mg/dL (ref 5–40)

## 2016-07-25 LAB — PSA: Prostate Specific Ag, Serum: 0.2 ng/mL (ref 0.0–4.0)

## 2016-07-27 LAB — FECAL OCCULT BLOOD, IMMUNOCHEMICAL: FECAL OCCULT BLD: NEGATIVE

## 2016-12-06 ENCOUNTER — Encounter: Payer: Self-pay | Admitting: Family Medicine

## 2016-12-06 ENCOUNTER — Ambulatory Visit (INDEPENDENT_AMBULATORY_CARE_PROVIDER_SITE_OTHER): Payer: BLUE CROSS/BLUE SHIELD | Admitting: Family Medicine

## 2016-12-06 VITALS — BP 106/67 | HR 64 | Temp 97.2°F | Ht 70.0 in | Wt 135.2 lb

## 2016-12-06 DIAGNOSIS — G629 Polyneuropathy, unspecified: Secondary | ICD-10-CM | POA: Diagnosis not present

## 2016-12-06 DIAGNOSIS — G479 Sleep disorder, unspecified: Secondary | ICD-10-CM

## 2016-12-06 MED ORDER — AMITRIPTYLINE HCL 25 MG PO TABS: 25.0000 mg | ORAL_TABLET | Freq: Every day | ORAL | 3 refills | Status: DC

## 2016-12-06 NOTE — Progress Notes (Signed)
   HPI  Patient presents today here to discuss sleep disturbance.  Patient states that for the several months he's had sleep disturbance. He describes that it's difficult to fall asleep and then he has the sensation that he's narrating the entire time in his dreams. When he wakes up he is still sleepy. He also states that he has the sensation of bugs crawling all over and just before falling asleep. He was previously using gabapentin for this with good relief, however this caused irritability and grogginess.  He lost his wife to gallbladder cancer about 2 years ago, he states currently he is going through difficult times without her companionship. His children are all in their early 30s. He has 3 children.   He denies suicidal thoughts he does have some intermittent depressed feelings.  PMH: Smoking status noted ROS: Per HPI  Objective: BP 106/67   Pulse 64   Temp (!) 97.2 F (36.2 C) (Oral)   Ht 5\' 10"  (1.778 m)   Wt 135 lb 3.2 oz (61.3 kg)   BMI 19.40 kg/m  Gen: NAD, alert, cooperative with exam HEENT: NCAT CV: RRR, good S1/S2, no murmur Resp: CTABL, no wheezes, non-labored Ext: No edema, warm Neuro: Alert and oriented, No gross deficits  Assessment and plan:  # Sleep disturbance, neuropathy A believe his feeling of bugs crawling all over and can most accurately be described as a neuropathy, he is not on any medications that should be causing this. Difficult to say that either of these are absolutely due to MS. Discussed several options and possibilities. We chose amitriptyline because that should help him sleep somewhat and does have some good possibility of helping the neuropathy symptoms similarly to gabapentin. Can also consider doxepin or belsomra, although he stated he was not eager to take a sleeping medication.   follo wup 4 weeks or so with his PCP or me.   Meds ordered this encounter  Medications  . amitriptyline (ELAVIL) 25 MG tablet    Sig: Take 1 tablet (25  mg total) by mouth at bedtime.    Dispense:  30 tablet    Refill:  3    Murtis Sink, MD [facility] 12/06/2016, 1:25 PM

## 2016-12-06 NOTE — Patient Instructions (Signed)
Great to see you!  Try the medicine for a week or so, if you cant tell if it is working increse to 2 pills at night.   Come back in about 4 weeks to follow up for sleep disturbance

## 2017-01-25 ENCOUNTER — Other Ambulatory Visit: Payer: Self-pay | Admitting: *Deleted

## 2017-01-25 MED ORDER — DOXYCYCLINE HYCLATE 100 MG PO TABS: 100.0000 mg | ORAL_TABLET | Freq: Two times a day (BID) | ORAL | 0 refills | Status: DC

## 2017-04-02 ENCOUNTER — Encounter: Payer: Self-pay | Admitting: Physician Assistant

## 2017-04-02 ENCOUNTER — Ambulatory Visit (INDEPENDENT_AMBULATORY_CARE_PROVIDER_SITE_OTHER): Payer: BLUE CROSS/BLUE SHIELD | Admitting: Physician Assistant

## 2017-04-02 VITALS — BP 101/66 | HR 70 | Temp 97.6°F | Ht 70.0 in | Wt 138.4 lb

## 2017-04-02 DIAGNOSIS — R112 Nausea with vomiting, unspecified: Secondary | ICD-10-CM

## 2017-04-02 DIAGNOSIS — R10811 Right upper quadrant abdominal tenderness: Secondary | ICD-10-CM | POA: Diagnosis not present

## 2017-04-02 LAB — CBC WITH DIFFERENTIAL/PLATELET
BASOS: 0 %
Basophils Absolute: 0 10*3/uL (ref 0.0–0.2)
EOS (ABSOLUTE): 0.1 10*3/uL (ref 0.0–0.4)
Eos: 3 %
Hematocrit: 41.7 % (ref 37.5–51.0)
Hemoglobin: 14 g/dL (ref 13.0–17.7)
IMMATURE GRANS (ABS): 0 10*3/uL (ref 0.0–0.1)
IMMATURE GRANULOCYTES: 0 %
LYMPHS: 38 %
Lymphocytes Absolute: 1.2 10*3/uL (ref 0.7–3.1)
MCH: 31.7 pg (ref 26.6–33.0)
MCHC: 33.6 g/dL (ref 31.5–35.7)
MCV: 94 fL (ref 79–97)
Monocytes Absolute: 0.4 10*3/uL (ref 0.1–0.9)
Monocytes: 12 %
NEUTROS PCT: 47 %
Neutrophils Absolute: 1.5 10*3/uL (ref 1.4–7.0)
PLATELETS: 225 10*3/uL (ref 150–379)
RBC: 4.42 x10E6/uL (ref 4.14–5.80)
RDW: 13.2 % (ref 12.3–15.4)
WBC: 3.2 10*3/uL — AB (ref 3.4–10.8)

## 2017-04-02 LAB — CMP14+EGFR
A/G RATIO: 1.8 (ref 1.2–2.2)
ALT: 9 IU/L (ref 0–44)
AST: 18 IU/L (ref 0–40)
Albumin: 4.4 g/dL (ref 3.5–5.5)
Alkaline Phosphatase: 41 IU/L (ref 39–117)
BUN/Creatinine Ratio: 16 (ref 9–20)
BUN: 16 mg/dL (ref 6–24)
Bilirubin Total: 0.3 mg/dL (ref 0.0–1.2)
CALCIUM: 9.3 mg/dL (ref 8.7–10.2)
CO2: 23 mmol/L (ref 20–29)
CREATININE: 1.02 mg/dL (ref 0.76–1.27)
Chloride: 104 mmol/L (ref 96–106)
GFR calc Af Amer: 95 mL/min/{1.73_m2} (ref >59)
GFR, EST NON AFRICAN AMERICAN: 82 mL/min/{1.73_m2} (ref >59)
Globulin, Total: 2.5 g/dL (ref 1.5–4.5)
Glucose: 95 mg/dL (ref 65–99)
POTASSIUM: 5 mmol/L (ref 3.5–5.2)
Sodium: 142 mmol/L (ref 134–144)
Total Protein: 6.9 g/dL (ref 6.0–8.5)

## 2017-04-02 NOTE — Progress Notes (Signed)
BP 101/66   Pulse 70   Temp 97.6 F (36.4 C) (Oral)   Ht _0  (1.778 m)   Wt 138 lb 6.4 oz (62.8 kg)   BMI 19.86 kg/m    Subjective:    Patient ID: Christopher Vance, male    DOB: [birth year], 56 y.o.   MRN: [id number]  HPI: Christopher Vance is a 56 y.o. male presenting on 04/02/2017 for Abdominal Pain (Right) and Emesis (1 week ago, intermittent)  Patient comes in having severe right upper quadrant pain that is fairly persistent.  He will sometimes relieved.  It does seem to be aggravated after eating.  It is not related to any certain food.  He denies any fever or chills.  He states the pain does go through to the back and up behind shoulder blade.  He is not having any changes in his stools.  In total it has been going on 3 or 4 months.  He has not had any change to his diet.  Relevant past medical, surgical, family and social history reviewed and updated as indicated. Allergies and medications reviewed and updated.  Past Medical History:  Diagnosis Date  . Acne rosacea   . DDD (degenerative disc disease), cervical   . Hyperlipidemia   . Multiple sclerosis ([place])   . Multiple sclerosis ([place])   . RLS (restless legs syndrome)     History reviewed. No pertinent surgical history.  Review of Systems  Constitutional: Negative.  Negative for appetite change, diaphoresis, fatigue, fever and unexpected weight change.  HENT: Negative.   Eyes: Negative.  Negative for pain and visual disturbance.  Respiratory: Negative.  Negative for cough, chest tightness, shortness of breath and wheezing.   Cardiovascular: Negative.  Negative for chest pain, palpitations and leg swelling.  Gastrointestinal: Positive for abdominal pain, diarrhea, nausea and vomiting. Negative for blood in stool and constipation.  Endocrine: Negative.  Negative for cold intolerance and heat intolerance.  Genitourinary: Negative.   Musculoskeletal: Negative.   Skin: Negative.  Negative for color change and rash.    Neurological: Negative.  Negative for weakness, numbness and headaches.  Psychiatric/Behavioral: Negative.     Allergies as of 04/02/2017      Reactions   Levofloxacin Nausea And Vomiting      Medication List        Accurate as of 04/02/17 11:17 AM. Always use your most recent med list.          amitriptyline 25 MG tablet Commonly known as:  ELAVIL Take 1 tablet (25 mg total) by mouth at bedtime.   clonazePAM 1 MG tablet Commonly known as:  KLONOPIN Take 1 mg by mouth 3 (three) times daily as needed for anxiety.   doxycycline 100 MG tablet Commonly known as:  VIBRA-TABS Take 1 tablet (100 mg total) by mouth 2 (two) times daily. 1 po bid   ibuprofen 800 MG tablet Commonly known as:  ADVIL,MOTRIN Take 1 tablet (800 mg total) by mouth every 8 (eight) hours as needed.   LINZESS 290 MCG Caps capsule Generic drug:  linaclotide   rizatriptan 10 MG disintegrating tablet Commonly known as:  MAXALT-MLT Take 1tab at earliest onset of headache.  May repeat once in 2 hours if needed.  Do not exceed 2 tabs in 24 hours.   rOPINIRole 2 MG tablet Commonly known as:  REQUIP Take 1 tablet (2 mg total) by mouth at bedtime.          Objective:  BP 101/66   Pulse 70   Temp 97.6 F (36.4 C) (Oral)   Ht _0  (1.778 m)   Wt 138 lb 6.4 oz (62.8 kg)   BMI 19.86 kg/m   Allergies  Allergen Reactions  . Levofloxacin Nausea And Vomiting    Physical Exam  Constitutional: He appears well-developed and well-nourished.  HENT:  Head: Normocephalic and atraumatic.  Eyes: Conjunctivae and EOM are normal. Pupils are equal, round, and reactive to light.  Neck: Normal range of motion. Neck supple.  Cardiovascular: Normal rate, regular rhythm and normal heart sounds.  Pulmonary/Chest: Effort normal and breath sounds normal.  Abdominal: Soft. Bowel sounds are normal. He exhibits no distension, no pulsatile liver, no abdominal bruit and no pulsatile midline mass. There is tenderness in  the right upper quadrant. There is no rigidity, no rebound, no guarding, no tenderness at McBurney's point and negative Murphy's sign.  Musculoskeletal: Normal range of motion.  Skin: Skin is warm and dry.    Results for orders placed or performed in visit on 07/25/16  Fecal occult blood, imunochemical  Result Value Ref Range   Fecal Occult Bld Negative Negative      Assessment & Plan:   1. Right upper quadrant abdominal tenderness without rebound tenderness - US Abdomen Complete; Future - CMP14+EGFR - CBC with Differential/Platelet  2. Non-intractable vomiting with nausea, unspecified vomiting type - US Abdomen Complete; Future - CMP14+EGFR - CBC with Differential/Platelet    Current Outpatient Medications:  .  amitriptyline (ELAVIL) 25 MG tablet, Take 1 tablet (25 mg total) by mouth at bedtime., Disp: 30 tablet, Rfl: 3 .  clonazePAM (KLONOPIN) 1 MG tablet, Take 1 mg by mouth 3 (three) times daily as needed for anxiety., Disp: , Rfl:  .  doxycycline (VIBRA-TABS) 100 MG tablet, Take 1 tablet (100 mg total) by mouth 2 (two) times daily. 1 po bid, Disp: 20 tablet, Rfl: 0 .  ibuprofen (ADVIL,MOTRIN) 800 MG tablet, Take 1 tablet (800 mg total) by mouth every 8 (eight) hours as needed., Disp: 60 tablet, Rfl: 4 .  LINZESS 290 MCG CAPS capsule, , Disp: , Rfl: 0 .  rizatriptan (MAXALT-MLT) 10 MG disintegrating tablet, Take 1tab at earliest onset of headache.  May repeat once in 2 hours if needed.  Do not exceed 2 tabs in 24 hours., Disp: 27 tablet, Rfl: 3 .  rOPINIRole (REQUIP) 2 MG tablet, Take 1 tablet (2 mg total) by mouth at bedtime., Disp: 180 tablet, Rfl: 3 Continue all other maintenance medications as listed above.  Follow up plan: Return if symptoms worsen or fail to improve.  Educational handout given for [place] PA-C [facility] [address]  [place], [place] [address] [phone]   04/02/2017, 11:17 AM

## 2017-04-02 NOTE — Patient Instructions (Signed)
In a few days you may receive a survey in the mail or online from [employer] regarding your visit with us today. Please take a moment to fill this out. Your feedback is very important to our whole office. It can help us better understand your needs as well as improve your experience and satisfaction. Thank you for taking your time to complete it. We care about you.  Kyndell Zeiser, PA-C  

## 2017-04-05 ENCOUNTER — Ambulatory Visit (HOSPITAL_COMMUNITY)
Admission: RE | Admit: 2017-04-05 | Discharge: 2017-04-05 | Disposition: A | Discharge status: Home / Self Care | Payer: BLUE CROSS/BLUE SHIELD | Source: Ambulatory Visit | Attending: Physician Assistant | Admitting: Physician Assistant

## 2017-04-05 DIAGNOSIS — R1011 Right upper quadrant pain: Secondary | ICD-10-CM | POA: Insufficient documentation

## 2017-04-05 DIAGNOSIS — R10811 Right upper quadrant abdominal tenderness: Secondary | ICD-10-CM

## 2017-04-05 DIAGNOSIS — R112 Nausea with vomiting, unspecified: Secondary | ICD-10-CM

## 2017-04-12 ENCOUNTER — Other Ambulatory Visit: Payer: Self-pay | Admitting: Physician Assistant

## 2017-04-17 ENCOUNTER — Telehealth: Payer: Self-pay | Admitting: Physician Assistant

## 2017-04-17 MED ORDER — DOXYCYCLINE HYCLATE 100 MG PO TABS: 100.0000 mg | ORAL_TABLET | Freq: Two times a day (BID) | ORAL | 6 refills | Status: DC

## 2017-04-17 NOTE — Telephone Encounter (Signed)
What is the name of the medication? doxycyline  Have you contacted your pharmacy to request a refill? Yes it was denied, he is out of the medication  Which pharmacy would you like this sent to? [facility]   Patient notified that their request is being sent to the clinical staff for review and that they should receive a call once it is complete. If they do not receive a call within 24 hours they can check with their pharmacy or our office.

## 2017-04-17 NOTE — Telephone Encounter (Signed)
Patient aware.

## 2017-06-01 ENCOUNTER — Other Ambulatory Visit: Payer: Self-pay | Admitting: Physician Assistant

## 2017-06-07 ENCOUNTER — Other Ambulatory Visit: Payer: Self-pay | Admitting: Physician Assistant

## 2017-06-07 DIAGNOSIS — G43109 Migraine with aura, not intractable, without status migrainosus: Secondary | ICD-10-CM

## 2017-06-10 ENCOUNTER — Other Ambulatory Visit: Payer: Self-pay | Admitting: *Deleted

## 2017-06-13 ENCOUNTER — Other Ambulatory Visit: Payer: Self-pay | Admitting: *Deleted

## 2017-06-13 MED ORDER — IBUPROFEN 800 MG PO TABS: 800.0000 mg | ORAL_TABLET | Freq: Three times a day (TID) | ORAL | 0 refills | Status: DC | PRN

## 2017-06-13 NOTE — Telephone Encounter (Signed)
Pt wanting to get med through mail order, refill sent

## 2017-08-05 ENCOUNTER — Ambulatory Visit (INDEPENDENT_AMBULATORY_CARE_PROVIDER_SITE_OTHER): Payer: BLUE CROSS/BLUE SHIELD | Admitting: Physician Assistant

## 2017-08-05 ENCOUNTER — Encounter: Payer: Self-pay | Admitting: Physician Assistant

## 2017-08-05 VITALS — BP 95/64 | HR 61 | Temp 98.5°F | Ht 70.0 in | Wt 138.8 lb

## 2017-08-05 DIAGNOSIS — Z0000 Encounter for general adult medical examination without abnormal findings: Secondary | ICD-10-CM

## 2017-08-05 NOTE — Progress Notes (Signed)
BP 95/64   Pulse 61   Temp 98.5 F (36.9 C) (Oral)   Ht '5\' 10"'$  (1.778 m)   Wt 138 lb 12.8 oz (63 kg)   BMI 19.92 kg/m    Subjective:    Patient ID: Christopher Vance, male    DOB: Oct 30, 1960, 57 y.o.   MRN: [id number]  HPI: Christopher Vance is a 57 y.o. male presenting on 08/05/2017 for Annual Exam  This patient comes in for annual well physical examination. All medications are reviewed today. There are no reports of any problems with the medications. All of the medical conditions are reviewed and updated.  Lab work is reviewed and will be ordered as medically necessary. There are no new problems reported with today's visit.  Patient reports doing well overall.  He also has had very good control of his multiple sclerosis.  He is still quite active and tries to eat well.  He tries to use as little medication as possible.  He is not even having very many migraines at this point.  Past Medical History:  Diagnosis Date  . Acne rosacea   . DDD (degenerative disc disease), cervical   . Hyperlipidemia   . Multiple sclerosis ([place])   . Multiple sclerosis ([place])   . RLS (restless legs syndrome)    Relevant past medical, surgical, family and social history reviewed and updated as indicated. Interim medical history since our last visit reviewed. Allergies and medications reviewed and updated. DATA REVIEWED: CHART IN EPIC  Family History reviewed for pertinent findings.  Review of Systems  Constitutional: Negative.  Negative for appetite change and fatigue.  HENT: Negative.   Eyes: Negative.  Negative for pain and visual disturbance.  Respiratory: Negative.  Negative for cough, chest tightness, shortness of breath and wheezing.   Cardiovascular: Negative.  Negative for chest pain, palpitations and leg swelling.  Gastrointestinal: Negative.  Negative for abdominal pain, diarrhea, nausea and vomiting.  Endocrine: Negative.   Genitourinary: Negative.   Musculoskeletal: Negative.   Skin:  Negative.  Negative for color change and rash.  Neurological: Negative.  Negative for weakness, numbness and headaches.  Psychiatric/Behavioral: Negative.     Allergies as of 08/05/2017      Reactions   Levofloxacin Nausea And Vomiting      Medication List        Accurate as of 08/05/17  1:22 PM. Always use your most recent med list.          clonazePAM 1 MG tablet Commonly known as:  KLONOPIN Take 1 mg by mouth 3 (three) times daily as needed for anxiety.   doxycycline 100 MG tablet Commonly known as:  VIBRA-TABS Take 1 tablet (100 mg total) by mouth 2 (two) times daily. 1 po bid   ibuprofen 800 MG tablet Commonly known as:  ADVIL,MOTRIN Take 1 tablet (800 mg total) by mouth every 8 (eight) hours as needed.   LINZESS 290 MCG Caps capsule Generic drug:  linaclotide   rizatriptan 10 MG disintegrating tablet Commonly known as:  MAXALT-MLT TAKE 1 TABLET AT EARLIEST ONSET OF HEADACHE. MAY REPEAT ONCE IN 2 HOURS IF NEEDED. DO NOT EXCEED 2 TABLETS IN 24 HOURS.   rOPINIRole 2 MG tablet Commonly known as:  REQUIP Take 1 tablet (2 mg total) by mouth at bedtime.          Objective:    BP 95/64   Pulse 61   Temp 98.5 F (36.9 C) (Oral)   Ht '5\' 10"'$  (1.778  m)   Wt 138 lb 12.8 oz (63 kg)   BMI 19.92 kg/m   Allergies  Allergen Reactions  . Levofloxacin Nausea And Vomiting    Wt Readings from Last 3 Encounters:  08/05/17 138 lb 12.8 oz (63 kg)  04/02/17 138 lb 6.4 oz (62.8 kg)  12/06/16 135 lb 3.2 oz (61.3 kg)    Physical Exam  Constitutional: He appears well-developed and well-nourished.  HENT:  Head: Normocephalic and atraumatic.  Eyes: Pupils are equal, round, and reactive to light. Conjunctivae and EOM are normal.  Neck: Normal range of motion. Neck supple.  Cardiovascular: Normal rate, regular rhythm and normal heart sounds.  Pulmonary/Chest: Effort normal and breath sounds normal.  Abdominal: Soft. Bowel sounds are normal.  Musculoskeletal: Normal range of  motion.  Skin: Skin is warm and dry.  Nursing note and vitals reviewed.   Results for orders placed or performed in visit on 04/02/17  CMP14+EGFR  Result Value Ref Range   Glucose 95 65 - 99 mg/dL   BUN 16 6 - 24 mg/dL   Creatinine, Ser 1.02 0.76 - 1.27 mg/dL   GFR calc non Af Amer 82 >59 mL/min/1.73   GFR calc Af Amer 95 >59 mL/min/1.73   BUN/Creatinine Ratio 16 9 - 20   Sodium 142 134 - 144 mmol/L   Potassium 5.0 3.5 - 5.2 mmol/L   Chloride 104 96 - 106 mmol/L   CO2 23 20 - 29 mmol/L   Calcium 9.3 8.7 - 10.2 mg/dL   Total Protein 6.9 6.0 - 8.5 g/dL   Albumin 4.4 3.5 - 5.5 g/dL   Globulin, Total 2.5 1.5 - 4.5 g/dL   Albumin/Globulin Ratio 1.8 1.2 - 2.2   Bilirubin Total 0.3 0.0 - 1.2 mg/dL   Alkaline Phosphatase 41 39 - 117 IU/L   AST 18 0 - 40 IU/L   ALT 9 0 - 44 IU/L  CBC with Differential/Platelet  Result Value Ref Range   WBC 3.2 (L) 3.4 - 10.8 x10E3/uL   RBC 4.42 4.14 - 5.80 x10E6/uL   Hemoglobin 14.0 13.0 - 17.7 g/dL   Hematocrit 41.7 37.5 - 51.0 %   MCV 94 79 - 97 fL   MCH 31.7 26.6 - 33.0 pg   MCHC 33.6 31.5 - 35.7 g/dL   RDW 13.2 12.3 - 15.4 %   Platelets 225 150 - 379 x10E3/uL   Neutrophils 47 Not Estab. %   Lymphs 38 Not Estab. %   Monocytes 12 Not Estab. %   Eos 3 Not Estab. %   Basos 0 Not Estab. %   Neutrophils Absolute 1.5 1.4 - 7.0 x10E3/uL   Lymphocytes Absolute 1.2 0.7 - 3.1 x10E3/uL   Monocytes Absolute 0.4 0.1 - 0.9 x10E3/uL   EOS (ABSOLUTE) 0.1 0.0 - 0.4 x10E3/uL   Basophils Absolute 0.0 0.0 - 0.2 x10E3/uL   Immature Granulocytes 0 Not Estab. %   Immature Grans (Abs) 0.0 0.0 - 0.1 x10E3/uL      Assessment & Plan:   1. Well adult exam - CBC with Differential/Platelet - CMP14+EGFR - Lipid panel - TSH   Continue all other maintenance medications as listed above.  Follow up plan: Return in about 1 year (around 08/06/2018) for annual exam.  Educational handout given for [place] PA-C [facility] [address]  [place], [place] [address] [phone]   08/05/2017, 1:22 PM

## 2017-08-06 LAB — CBC WITH DIFFERENTIAL/PLATELET
BASOS ABS: 0 10*3/uL (ref 0.0–0.2)
Basos: 0 %
EOS (ABSOLUTE): 0 10*3/uL (ref 0.0–0.4)
Eos: 0 %
HEMOGLOBIN: 13.2 g/dL (ref 13.0–17.7)
Hematocrit: 38.7 % (ref 37.5–51.0)
Immature Grans (Abs): 0 10*3/uL (ref 0.0–0.1)
Immature Granulocytes: 0 %
LYMPHS ABS: 1 10*3/uL (ref 0.7–3.1)
Lymphs: 21 %
MCH: 32 pg (ref 26.6–33.0)
MCHC: 34.1 g/dL (ref 31.5–35.7)
MCV: 94 fL (ref 79–97)
Monocytes Absolute: 0.5 10*3/uL (ref 0.1–0.9)
Monocytes: 9 %
NEUTROS ABS: 3.5 10*3/uL (ref 1.4–7.0)
Neutrophils: 70 %
PLATELETS: 214 10*3/uL (ref 150–379)
RBC: 4.12 x10E6/uL — ABNORMAL LOW (ref 4.14–5.80)
RDW: 13.4 % (ref 12.3–15.4)
WBC: 5 10*3/uL (ref 3.4–10.8)

## 2017-08-06 LAB — LIPID PANEL
CHOLESTEROL TOTAL: 174 mg/dL (ref 100–199)
Chol/HDL Ratio: 3.2 ratio (ref 0.0–5.0)
HDL: 54 mg/dL (ref >39)
LDL Calculated: 112 mg/dL — ABNORMAL HIGH (ref 0–99)
Triglycerides: 39 mg/dL (ref 0–149)
VLDL CHOLESTEROL CAL: 8 mg/dL (ref 5–40)

## 2017-08-06 LAB — CMP14+EGFR
A/G RATIO: 1.7 (ref 1.2–2.2)
ALBUMIN: 4.1 g/dL (ref 3.5–5.5)
ALK PHOS: 39 IU/L (ref 39–117)
ALT: 12 IU/L (ref 0–44)
AST: 18 IU/L (ref 0–40)
BILIRUBIN TOTAL: 0.5 mg/dL (ref 0.0–1.2)
BUN / CREAT RATIO: 17 (ref 9–20)
BUN: 17 mg/dL (ref 6–24)
CO2: 24 mmol/L (ref 20–29)
Calcium: 9.1 mg/dL (ref 8.7–10.2)
Chloride: 106 mmol/L (ref 96–106)
Creatinine, Ser: 1.01 mg/dL (ref 0.76–1.27)
GFR calc Af Amer: 96 mL/min/{1.73_m2} (ref >59)
GFR calc non Af Amer: 83 mL/min/{1.73_m2} (ref >59)
GLUCOSE: 92 mg/dL (ref 65–99)
Globulin, Total: 2.4 g/dL (ref 1.5–4.5)
Potassium: 4.5 mmol/L (ref 3.5–5.2)
Sodium: 142 mmol/L (ref 134–144)
Total Protein: 6.5 g/dL (ref 6.0–8.5)

## 2017-08-06 LAB — TSH: TSH: 0.855 u[IU]/mL (ref 0.450–4.500)

## 2017-08-19 ENCOUNTER — Other Ambulatory Visit: Payer: Self-pay | Admitting: Physician Assistant

## 2017-11-17 ENCOUNTER — Other Ambulatory Visit: Payer: Self-pay | Admitting: Physician Assistant

## 2018-04-09 ENCOUNTER — Ambulatory Visit (INDEPENDENT_AMBULATORY_CARE_PROVIDER_SITE_OTHER): Payer: BLUE CROSS/BLUE SHIELD | Admitting: Family Medicine

## 2018-04-09 ENCOUNTER — Encounter: Payer: Self-pay | Admitting: Family Medicine

## 2018-04-09 VITALS — BP 118/73 | HR 76 | Temp 98.3°F | Ht 70.0 in | Wt 145.0 lb

## 2018-04-09 DIAGNOSIS — M7582 Other shoulder lesions, left shoulder: Secondary | ICD-10-CM | POA: Diagnosis not present

## 2018-04-09 DIAGNOSIS — J329 Chronic sinusitis, unspecified: Secondary | ICD-10-CM | POA: Diagnosis not present

## 2018-04-09 MED ORDER — AMOXICILLIN-POT CLAVULANATE 875-125 MG PO TABS: 1.0000 | ORAL_TABLET | Freq: Two times a day (BID) | ORAL | 0 refills | Status: AC

## 2018-04-09 NOTE — Patient Instructions (Signed)
It appears that you have a viral upper respiratory infection (cold).  Cold symptoms can last up to 2 weeks.    - Get plenty of rest and drink plenty of fluids. - Try to breathe moist air. Use a cold mist humidifier. - Consume warm fluids (soup or tea) to provide relief for a stuffy nose and to loosen phlegm. - For nasal stuffiness, try saline nasal spray or a Neti Pot. Afrin nasal spray can also be used but this product should not be used longer than 3 days or it will cause rebound nasal stuffiness (worsening nasal congestion). - For sore throat pain relief: use chloraseptic spray, suck on throat lozenges, hard candy or popsicles; gargle with warm salt water (1/4 tsp. salt per 8 oz. of water); and eat soft, bland foods. - Eat a well-balanced diet. If you cannot, ensure you are getting enough nutrients by taking a daily multivitamin. - Avoid dairy products, as they can thicken phlegm. - Avoid alcohol, as it impairs your body's immune system.  CONTACT YOUR DOCTOR IF YOU EXPERIENCE ANY OF THE FOLLOWING: - High fever - Ear pain - Sinus-type headache - Unusually severe cold symptoms - Cough that gets worse while other cold symptoms improve - Flare up of any chronic lung problem, such as asthma - Your symptoms persist longer than 2 weeks     For your shoulder: Take 800 mg of ibuprofen every 8 hours if needed for pain and inflammation.  Make sure that you are also taking a Tylenol with each dose of ibuprofen.  I have given you home physical therapy exercises to perform.  If symptoms worsen or do not improve with the after mentioned therapies in the next 2 to 4 weeks, I would recommend consideration for orthopedists referral

## 2018-04-09 NOTE — Progress Notes (Signed)
Subjective: CC: shoulder pain PCP: Remus Loffler, PA-C [id number] E Reish is a 57 y.o. male presenting to clinic today for:  1. Shoulder pain Patient reports a couple week history of left-sided shoulder pain.  He notes that pain seems to be worse with elevation of the left upper extremity above shoulder level.  He denies any preceding injury.  No sensation changes.  No weakness.  He is right-hand dominant.  Work does not require any heavy lifting, pushing or pulling.  He works typing.  He does note that the pain seems to be worse when he goes to exercise however.  He has been taking ibuprofen 800 mg but has not noticed substantial relief in the symptoms.  Past medical history is significant for frozen shoulder as a teenager, which required corticosteroid injection.  He is not amenable to corticosteroid injection today.  2.  Sinus symptoms Patient reports that sinus symptoms initially began as allergy symptoms with rhinorrhea.  The nasal discharge has become much thicker and has some scant blood in it.  He has been using Mucinex, Flonase.  A couple of days ago he had a temperature 100 agrees [employer].  No associated shortness of breath, wheeze or hemoptysis.   ROS: Per HPI  Allergies  Allergen Reactions  . Levofloxacin Nausea And Vomiting   Past Medical History:  Diagnosis Date  . Acne rosacea   . DDD (degenerative disc disease), cervical   . Hyperlipidemia   . Multiple sclerosis ([facility])   . Multiple sclerosis ([facility])   . RLS (restless legs syndrome)     Current Outpatient Medications:  .  ibuprofen (ADVIL,MOTRIN) 800 MG tablet, TAKE 1 TABLET EVERY 8 HOURS AS NEEDED, Disp: 270 tablet, Rfl: 1 .  doxycycline (VIBRA-TABS) 100 MG tablet, Take 1 tablet (100 mg total) by mouth 2 (two) times daily. 1 po bid (Patient not taking: Reported on 04/09/2018), Disp: 20 tablet, Rfl: 6 .  LINZESS 290 MCG CAPS capsule, , Disp: , Rfl: 0 Social History   Socioeconomic History  . Marital status:  Widowed    Spouse name: Not on file  . Number of children: Not on file  . Years of education: Not on file  . Highest education level: Not on file  Occupational History  . Not on file  Social Needs  . Financial resource strain: Not on file  . Food insecurity:    Worry: Not on file    Inability: Not on file  . Transportation needs:    Medical: Not on file    Non-medical: Not on file  Tobacco Use  . Smoking status: Never Smoker  . Smokeless tobacco: Never Used  Substance and Sexual Activity  . Alcohol use: No  . Drug use: No  . Sexual activity: Not on file  Lifestyle  . Physical activity:    Days per week: Not on file    Minutes per session: Not on file  . Stress: Not on file  Relationships  . Social connections:    Talks on phone: Not on file    Gets together: Not on file    Attends religious service: Not on file    Active member of club or organization: Not on file    Attends meetings of clubs or organizations: Not on file    Relationship status: Not on file  . Intimate partner violence:    Fear of current or ex partner: Not on file    Emotionally abused: Not on file    Physically  abused: Not on file    Forced sexual activity: Not on file  Other Topics Concern  . Not on file  Social History Narrative   Pt lives with his daughter, one story home. Completed 12th grade.    Family History  Problem Relation Age of Onset  . Hypertension Mother     Objective: Office vital signs reviewed. BP 118/73   Pulse 76   Temp 98.3 F (36.8 C) (Oral)   Ht 5\' 10"  (1.778 m)   Wt 145 lb (65.8 kg)   BMI 20.81 kg/m   Physical Examination:  General: Awake, alert, well nourished, well appearing. No acute distress HEENT: Normal    Neck: No masses palpated. Minimally enlarged anterior cervical lymph node on right. Nontender.  Node is mobile.    Ears: Tympanic membranes intact, normal light reflex, no erythema, no bulging    Eyes: PERRLA, extraocular membranes intact, sclera  white    Nose: nasal turbinates moist, clear nasal discharge; there is a hemostatic bleed in the left nasal cavity medially.    Throat: moist mucus membranes, no erythema, no exudate.  Airway is patent Cardio: regular rate and rhythm, S1S2 heard, no murmurs appreciated Pulm: clear to auscultation bilaterally, no wheezes, rhonchi or rales; normal work of breathing on room air Extremities: warm, well perfused, No edema, cyanosis or clubbing; +2 pulses bilaterally MSK: normal gait and station  Left shoulder: Patient has limited active range of motion abduction.  He has point tenderness to the anterior lateral aspect of the shoulder.  There are no palpable bony abnormalities or deformities.  He has pain with empty can testing.  Negative [place]. Neuro: 5/5 UE Strength and light touch sensation grossly intact  Assessment/ Plan: 57 y.o. male   1. Rotator cuff tendinitis, left ?  Supraspinatus involvement given positive empty can.  At this point doubt tear in the absence of trauma and given preserved muscle strength.  Likely tendinitis.  We discussed home physical therapy exercises and ways to preserve range of motion.  I offered alternative NSAID, corticosteroid injection and referral to orthopedist but patient declined.  He wishes to proceed with Motrin 800 mg.  Advised to increase to 3 times daily and take with Tylenol.  Reasons for return discussed.  He will follow-up PRN.  2. Rhinosinusitis No evidence of bacterial infection on exam.  More likely to be viral.  I have given him a written prescription to use if symptoms worsen or do not improve.  Patient was good understanding for indications of use.  Home care instructions reviewed and a handout was provided.  Follow-up PRN.   No orders of the defined types were placed in this encounter.  Meds ordered this encounter  Medications  . amoxicillin-clavulanate (AUGMENTIN) 875-125 MG tablet    Sig: Take 1 tablet by mouth 2 (two) times daily.     Dispense:  20 tablet    Refill:  0     Rosalie Gelpi Hulen Skains, DO [facility] [phone]

## 2018-05-17 ENCOUNTER — Other Ambulatory Visit: Payer: Self-pay | Admitting: Physician Assistant

## 2018-07-09 ENCOUNTER — Other Ambulatory Visit: Payer: Self-pay

## 2018-07-09 ENCOUNTER — Ambulatory Visit (INDEPENDENT_AMBULATORY_CARE_PROVIDER_SITE_OTHER): Payer: BLUE CROSS/BLUE SHIELD | Admitting: Physician Assistant

## 2018-07-09 ENCOUNTER — Encounter: Payer: Self-pay | Admitting: Physician Assistant

## 2018-07-09 VITALS — BP 101/75 | HR 123 | Temp 102.6°F | Ht 70.0 in | Wt 144.6 lb

## 2018-07-09 DIAGNOSIS — J101 Influenza due to other identified influenza virus with other respiratory manifestations: Secondary | ICD-10-CM

## 2018-07-09 DIAGNOSIS — R52 Pain, unspecified: Secondary | ICD-10-CM

## 2018-07-09 LAB — VERITOR FLU A/B WAIVED
Influenza A: POSITIVE — AB
Influenza B: NEGATIVE

## 2018-07-09 MED ORDER — HYDROCODONE-HOMATROPINE 5-1.5 MG/5ML PO SYRP: 5.0000 mL | ORAL_SOLUTION | Freq: Four times a day (QID) | ORAL | 0 refills | Status: DC | PRN

## 2018-07-09 MED ORDER — OSELTAMIVIR PHOSPHATE 75 MG PO CAPS: 75.0000 mg | ORAL_CAPSULE | Freq: Two times a day (BID) | ORAL | 0 refills | Status: DC

## 2018-07-09 NOTE — Progress Notes (Signed)
BP 101/75   Pulse (!) 123   Temp (!) 102.6 F (39.2 C) (Oral)   Ht 5\' 10"  (1.778 m)   Wt 144 lb 9.6 oz (65.6 kg)   BMI 20.75 kg/m    Subjective:    Patient ID: Christopher Vance, male    DOB: Nov 14, 1960, 58 y.o.   MRN: [id number]  HPI: Christopher Vance is a 58 y.o. male presenting on 07/09/2018 for Headache; Generalized Body Aches; and Chills  This patient has had less than 2 days severe fever, chills, myalgias.  Complains of sinus headache and postnasal drainage. There is copious drainage at times. Associated sore throat, decreased appetite and headache.  Has been exposed to influenza.    Past Medical History:  Diagnosis Date  . Acne rosacea   . DDD (degenerative disc disease), cervical   . Hyperlipidemia   . Multiple sclerosis ([facility])   . Multiple sclerosis ([facility])   . RLS (restless legs syndrome)    Relevant past medical, surgical, family and social history reviewed and updated as indicated. Interim medical history since our last visit reviewed. Allergies and medications reviewed and updated. DATA REVIEWED: CHART IN EPIC  Family History reviewed for pertinent findings.  Review of Systems  Constitutional: Positive for activity change, fatigue and fever. Negative for appetite change.  HENT: Positive for congestion and sore throat. Negative for sinus pressure.   Eyes: Negative.  Negative for pain and visual disturbance.  Respiratory: Positive for cough. Negative for chest tightness, shortness of breath and wheezing.   Cardiovascular: Negative.  Negative for chest pain, palpitations and leg swelling.  Gastrointestinal: Positive for nausea. Negative for abdominal pain, diarrhea and vomiting.  Endocrine: Negative.   Genitourinary: Negative.   Musculoskeletal: Positive for back pain and myalgias. Negative for arthralgias.  Skin: Negative.  Negative for color change and rash.  Neurological: Positive for headaches. Negative for weakness and numbness.  Psychiatric/Behavioral:  Negative.     Allergies as of 07/09/2018      Reactions   Levofloxacin Nausea And Vomiting      Medication List       Accurate as of July 09, 2018 12:06 PM. Always use your most recent med list.        HYDROcodone-homatropine 5-1.5 MG/5ML syrup Commonly known as:  HYCODAN Take 5-10 mLs by mouth every 6 (six) hours as needed.   ibuprofen 800 MG tablet Commonly known as:  ADVIL,MOTRIN TAKE 1 TABLET EVERY 8 HOURS AS NEEDED   Linzess 290 MCG Caps capsule Generic drug:  linaclotide   oseltamivir 75 MG capsule Commonly known as:  Tamiflu Take 1 capsule (75 mg total) by mouth 2 (two) times daily.          Objective:    BP 101/75   Pulse (!) 123   Temp (!) 102.6 F (39.2 C) (Oral)   Ht 5\' 10"  (1.778 m)   Wt 144 lb 9.6 oz (65.6 kg)   BMI 20.75 kg/m   Allergies  Allergen Reactions  . Levofloxacin Nausea And Vomiting    Wt Readings from Last 3 Encounters:  07/09/18 144 lb 9.6 oz (65.6 kg)  04/09/18 145 lb (65.8 kg)  08/05/17 138 lb 12.8 oz (63 kg)    Physical Exam Vitals signs and nursing note reviewed.  Constitutional:      General: He is not in acute distress.    Appearance: He is well-developed. He is ill-appearing.  HENT:     Head: Normocephalic and atraumatic.  Right Ear: Tympanic membrane normal. No drainage. No middle ear effusion.     Left Ear: Tympanic membrane normal. No drainage.  No middle ear effusion.     Nose: Mucosal edema and rhinorrhea present.     Right Sinus: No maxillary sinus tenderness.     Left Sinus: No maxillary sinus tenderness.     Mouth/Throat:     Pharynx: Uvula midline. Posterior oropharyngeal erythema present. No oropharyngeal exudate.  Eyes:     General:        Right eye: No discharge.        Left eye: No discharge.     Conjunctiva/sclera: Conjunctivae normal.     Pupils: Pupils are equal, round, and reactive to light.  Neck:     Musculoskeletal: Normal range of motion.  Cardiovascular:     Rate and Rhythm: Normal  rate and regular rhythm.     Heart sounds: Normal heart sounds.  Pulmonary:     Effort: Pulmonary effort is normal. No respiratory distress.     Breath sounds: Normal breath sounds. No wheezing.  Abdominal:     Palpations: Abdomen is soft.  Lymphadenopathy:     Cervical: No cervical adenopathy.  Skin:    General: Skin is warm and dry.  Neurological:     Mental Status: He is alert and oriented to person, place, and time.  Psychiatric:        Behavior: Behavior normal.         Assessment & Plan:   1. Body aches - Veritor Flu A/B Waived  2. Influenza A - oseltamivir (TAMIFLU) 75 MG capsule; Take 1 capsule (75 mg total) by mouth 2 (two) times daily.  Dispense: 10 capsule; Refill: 0 - HYDROcodone-homatropine (HYCODAN) 5-1.5 MG/5ML syrup; Take 5-10 mLs by mouth every 6 (six) hours as needed.  Dispense: 240 mL; Refill: 0   Continue all other maintenance medications as listed above.  Follow up plan: No follow-ups on file.  Educational handout given for survey  Remus Loffler PA-C [facility] [address]  [place], [place] [address] [phone]   07/09/2018, 12:06 PM

## 2018-08-17 ENCOUNTER — Other Ambulatory Visit: Payer: Self-pay | Admitting: Physician Assistant

## 2018-11-15 ENCOUNTER — Other Ambulatory Visit: Payer: Self-pay | Admitting: Physician Assistant

## 2018-12-01 DIAGNOSIS — H16001 Unspecified corneal ulcer, right eye: Secondary | ICD-10-CM | POA: Diagnosis not present

## 2018-12-05 DIAGNOSIS — H16001 Unspecified corneal ulcer, right eye: Secondary | ICD-10-CM | POA: Diagnosis not present

## 2018-12-22 ENCOUNTER — Other Ambulatory Visit: Payer: Self-pay

## 2018-12-23 ENCOUNTER — Other Ambulatory Visit: Payer: Self-pay

## 2018-12-23 ENCOUNTER — Ambulatory Visit (INDEPENDENT_AMBULATORY_CARE_PROVIDER_SITE_OTHER): Payer: BC Managed Care – PPO | Admitting: Physician Assistant

## 2018-12-23 ENCOUNTER — Encounter: Payer: Self-pay | Admitting: Physician Assistant

## 2018-12-23 VITALS — BP 98/65 | HR 64 | Temp 97.8°F | Ht 70.0 in | Wt 138.6 lb

## 2018-12-23 DIAGNOSIS — G8929 Other chronic pain: Secondary | ICD-10-CM | POA: Diagnosis not present

## 2018-12-23 DIAGNOSIS — Z0000 Encounter for general adult medical examination without abnormal findings: Secondary | ICD-10-CM | POA: Diagnosis not present

## 2018-12-23 DIAGNOSIS — Z0001 Encounter for general adult medical examination with abnormal findings: Secondary | ICD-10-CM

## 2018-12-23 DIAGNOSIS — M25512 Pain in left shoulder: Secondary | ICD-10-CM

## 2018-12-23 DIAGNOSIS — H16001 Unspecified corneal ulcer, right eye: Secondary | ICD-10-CM | POA: Diagnosis not present

## 2018-12-23 MED ORDER — DICLOFENAC SODIUM 1 % TD GEL: 4.0000 g | Freq: Four times a day (QID) | TRANSDERMAL | 5 refills | Status: DC

## 2018-12-24 LAB — CMP14+EGFR
ALT: 16 IU/L (ref 0–44)
AST: 22 IU/L (ref 0–40)
Albumin/Globulin Ratio: 1.9 (ref 1.2–2.2)
Albumin: 4.4 g/dL (ref 3.8–4.9)
Alkaline Phosphatase: 36 IU/L — ABNORMAL LOW (ref 39–117)
BUN/Creatinine Ratio: 17 (ref 9–20)
BUN: 15 mg/dL (ref 6–24)
Bilirubin Total: 0.7 mg/dL (ref 0.0–1.2)
CO2: 23 mmol/L (ref 20–29)
Calcium: 9.2 mg/dL (ref 8.7–10.2)
Chloride: 102 mmol/L (ref 96–106)
Creatinine, Ser: 0.89 mg/dL (ref 0.76–1.27)
GFR calc Af Amer: 110 mL/min/{1.73_m2} (ref >59)
GFR calc non Af Amer: 95 mL/min/{1.73_m2} (ref >59)
Globulin, Total: 2.3 g/dL (ref 1.5–4.5)
Glucose: 75 mg/dL (ref 65–99)
Potassium: 4.1 mmol/L (ref 3.5–5.2)
Sodium: 138 mmol/L (ref 134–144)
Total Protein: 6.7 g/dL (ref 6.0–8.5)

## 2018-12-24 LAB — PSA: Prostate Specific Ag, Serum: 0.2 ng/mL (ref 0.0–4.0)

## 2018-12-24 LAB — CBC WITH DIFFERENTIAL/PLATELET
Basophils Absolute: 0 10*3/uL (ref 0.0–0.2)
Basos: 1 %
EOS (ABSOLUTE): 0.1 10*3/uL (ref 0.0–0.4)
Eos: 1 %
Hematocrit: 42.1 % (ref 37.5–51.0)
Hemoglobin: 13.6 g/dL (ref 13.0–17.7)
Immature Grans (Abs): 0 10*3/uL (ref 0.0–0.1)
Immature Granulocytes: 0 %
Lymphocytes Absolute: 1.8 10*3/uL (ref 0.7–3.1)
Lymphs: 34 %
MCH: 31.4 pg (ref 26.6–33.0)
MCHC: 32.3 g/dL (ref 31.5–35.7)
MCV: 97 fL (ref 79–97)
Monocytes Absolute: 0.7 10*3/uL (ref 0.1–0.9)
Monocytes: 12 %
Neutrophils Absolute: 2.8 10*3/uL (ref 1.4–7.0)
Neutrophils: 52 %
Platelets: 190 10*3/uL (ref 150–450)
RBC: 4.33 x10E6/uL (ref 4.14–5.80)
RDW: 12.5 % (ref 11.6–15.4)
WBC: 5.3 10*3/uL (ref 3.4–10.8)

## 2018-12-24 LAB — LIPID PANEL
Chol/HDL Ratio: 2.7 ratio (ref 0.0–5.0)
Cholesterol, Total: 189 mg/dL (ref 100–199)
HDL: 70 mg/dL (ref >39)
LDL Calculated: 111 mg/dL — ABNORMAL HIGH (ref 0–99)
Triglycerides: 39 mg/dL (ref 0–149)
VLDL Cholesterol Cal: 8 mg/dL (ref 5–40)

## 2018-12-24 LAB — TSH: TSH: 1.29 u[IU]/mL (ref 0.450–4.500)

## 2018-12-25 DIAGNOSIS — G8929 Other chronic pain: Secondary | ICD-10-CM | POA: Insufficient documentation

## 2018-12-25 NOTE — Progress Notes (Addendum)
BP 98/65   Pulse 64   Temp 97.8 F (36.6 C) (Temporal)   Ht '5\' 10"'$  (1.778 m)   Wt 138 lb 9.6 oz (62.9 kg)   BMI 19.89 kg/m    Subjective:    Patient ID: Christopher Vance, male    DOB: 1960/12/05, 58 y.o.   MRN: [id number]  HPI: Christopher Vance is a 58 y.o. male presenting on 12/23/2018 for Shoulder Pain (left ) and Annual Exam  He comes in for his regular annual exam and labs. He has no issues at this time. His multiple sclerosis is under very good control. The left shoulder is still chronically bothering him.  He had had a visit back in December where the left shoulder was evaluated.  And more than likely it is something in his rotator cuff.  He feels like sometimes he gets a little bit of strength that is down.  The medication has helped.  We have discussed that orthopedics can put an injection in it.  He states it is not bad at this time.  But let us know if it gets that bad.  Past Medical History:  Diagnosis Date  . Acne rosacea   . DDD (degenerative disc disease), cervical   . Hyperlipidemia   . Multiple sclerosis ([place])   . Multiple sclerosis ([place])   . RLS (restless legs syndrome)    Relevant past medical, surgical, family and social history reviewed and updated as indicated. Interim medical history since our last visit reviewed. Allergies and medications reviewed and updated. DATA REVIEWED: CHART IN EPIC  Family History reviewed for pertinent findings.  Review of Systems  Constitutional: Negative.  Negative for appetite change and fatigue.  Eyes: Negative for pain and visual disturbance.  Respiratory: Negative.  Negative for cough, chest tightness, shortness of breath and wheezing.   Cardiovascular: Negative.  Negative for chest pain, palpitations and leg swelling.  Gastrointestinal: Negative.  Negative for abdominal pain, diarrhea, nausea and vomiting.  Genitourinary: Negative.   Musculoskeletal: Positive for arthralgias, myalgias and neck pain.  Skin: Negative.   Negative for color change and rash.  Neurological: Negative.  Negative for weakness, numbness and headaches.  Psychiatric/Behavioral: Negative.     Allergies as of 12/23/2018      Reactions   Levofloxacin Nausea And Vomiting      Medication List       Accurate as of December 23, 2018 11:59 PM. If you have any questions, ask your nurse or doctor.        STOP taking these medications   HYDROcodone-homatropine 5-1.5 MG/5ML syrup Commonly known as: HYCODAN Stopped by: Terald Sleeper, PA-C   oseltamivir 75 MG capsule Commonly known as: Tamiflu Stopped by: Terald Sleeper, PA-C     TAKE these medications   diclofenac sodium 1 % Gel Commonly known as: VOLTAREN Apply 4 g topically 4 (four) times daily. Started by: Terald Sleeper, PA-C   ibuprofen 800 MG tablet Commonly known as: ADVIL TAKE 1 TABLET EVERY 8 HOURS AS NEEDED   Linzess 290 MCG Caps capsule Generic drug: linaclotide          Objective:    BP 98/65   Pulse 64   Temp 97.8 F (36.6 C) (Temporal)   Ht '5\' 10"'$  (1.778 m)   Wt 138 lb 9.6 oz (62.9 kg)   BMI 19.89 kg/m   Allergies  Allergen Reactions  . Levofloxacin Nausea And Vomiting    Wt Readings from Last 3 Encounters:  12/23/18 138 lb 9.6 oz (62.9 kg)  07/09/18 144 lb 9.6 oz (65.6 kg)  04/09/18 145 lb (65.8 kg)    Physical Exam Vitals signs and nursing note reviewed.  Constitutional:      General: He is not in acute distress.    Appearance: He is well-developed.  HENT:     Head: Normocephalic and atraumatic.  Eyes:     Conjunctiva/sclera: Conjunctivae normal.     Pupils: Pupils are equal, round, and reactive to light.  Cardiovascular:     Rate and Rhythm: Normal rate and regular rhythm.     Heart sounds: Normal heart sounds.  Pulmonary:     Effort: Pulmonary effort is normal. No respiratory distress.     Breath sounds: Normal breath sounds.  Musculoskeletal:     Left shoulder: He exhibits decreased range of motion, tenderness and pain. He  exhibits no deformity.       Arms:  Skin:    General: Skin is warm and dry.  Psychiatric:        Behavior: Behavior normal.     Results for orders placed or performed in visit on 12/23/18  CBC with Differential/Platelet  Result Value Ref Range   WBC 5.3 3.4 - 10.8 x10E3/uL   RBC 4.33 4.14 - 5.80 x10E6/uL   Hemoglobin 13.6 13.0 - 17.7 g/dL   Hematocrit 42.1 37.5 - 51.0 %   MCV 97 79 - 97 fL   MCH 31.4 26.6 - 33.0 pg   MCHC 32.3 31.5 - 35.7 g/dL   RDW 12.5 11.6 - 15.4 %   Platelets 190 150 - 450 x10E3/uL   Neutrophils 52 Not Estab. %   Lymphs 34 Not Estab. %   Monocytes 12 Not Estab. %   Eos 1 Not Estab. %   Basos 1 Not Estab. %   Neutrophils Absolute 2.8 1.4 - 7.0 x10E3/uL   Lymphocytes Absolute 1.8 0.7 - 3.1 x10E3/uL   Monocytes Absolute 0.7 0.1 - 0.9 x10E3/uL   EOS (ABSOLUTE) 0.1 0.0 - 0.4 x10E3/uL   Basophils Absolute 0.0 0.0 - 0.2 x10E3/uL   Immature Granulocytes 0 Not Estab. %   Immature Grans (Abs) 0.0 0.0 - 0.1 x10E3/uL  CMP14+EGFR  Result Value Ref Range   Glucose 75 65 - 99 mg/dL   BUN 15 6 - 24 mg/dL   Creatinine, Ser 0.89 0.76 - 1.27 mg/dL   GFR calc non Af Amer 95 >59 mL/min/1.73   GFR calc Af Amer 110 >59 mL/min/1.73   BUN/Creatinine Ratio 17 9 - 20   Sodium 138 134 - 144 mmol/L   Potassium 4.1 3.5 - 5.2 mmol/L   Chloride 102 96 - 106 mmol/L   CO2 23 20 - 29 mmol/L   Calcium 9.2 8.7 - 10.2 mg/dL   Total Protein 6.7 6.0 - 8.5 g/dL   Albumin 4.4 3.8 - 4.9 g/dL   Globulin, Total 2.3 1.5 - 4.5 g/dL   Albumin/Globulin Ratio 1.9 1.2 - 2.2   Bilirubin Total 0.7 0.0 - 1.2 mg/dL   Alkaline Phosphatase 36 (L) 39 - 117 IU/L   AST 22 0 - 40 IU/L   ALT 16 0 - 44 IU/L  Lipid panel  Result Value Ref Range   Cholesterol, Total 189 100 - 199 mg/dL   Triglycerides 39 0 - 149 mg/dL   HDL 70 >39 mg/dL   VLDL Cholesterol Cal 8 5 - 40 mg/dL   LDL Calculated 111 (H) 0 - 99 mg/dL   Chol/HDL Ratio 2.7  0.0 - 5.0 ratio  TSH  Result Value Ref Range   TSH 1.290 0.450 -  4.500 uIU/mL  PSA  Result Value Ref Range   Prostate Specific Ag, Serum 0.2 0.0 - 4.0 ng/mL      Assessment & Plan:   1. Well adult exam - CBC with Differential/Platelet - CMP14+EGFR - Lipid panel - TSH - PSA  2. Chronic left shoulder pain - diclofenac sodium (VOLTAREN) 1 % GEL; Apply 4 g topically 4 (four) times daily.  Dispense: 200 g; Refill: 5   Continue all other maintenance medications as listed above.  Follow up plan: No follow-ups on file.  Educational handout given for [place] PA-C [place] [address]  [place], [place] [address] [phone]   12/25/2018, 7:26 PM

## 2018-12-26 ENCOUNTER — Telehealth: Payer: Self-pay | Admitting: *Deleted

## 2018-12-26 NOTE — Telephone Encounter (Signed)
Prior Auth for Diclofenac Sodium Gel 1%-In Process  Key: [id number] -   PA Case ID: [id number]   [employer] is reviewing your PA request and will respond within 24 hours for Medicaid or up to 72 hours for non-Medicaid plans, based on the required timeframe determined by state or federal regulations. To check for an update later, open this request from your dashboard.

## 2018-12-27 ENCOUNTER — Encounter: Payer: Self-pay | Admitting: Physician Assistant

## 2018-12-29 NOTE — Telephone Encounter (Signed)
Can you please let the patient know the status of those?  I think he would be willing to try the over-the-counter.  Otherwise we will have to try another anti-inflammatory.

## 2018-12-29 NOTE — Telephone Encounter (Signed)
Prior Auth for Diclofenac Sodium Gel 1%-Denied  Pt can purchase OTC Voltaren or please send in a preferred. Pt mush have documented tried and failure of 2. I only see the Ibuprofen 800mg .   Preferred generic NSAIDs include: diclofenac sodium (IR and ER), diclofenac potassium, diclofenac sodiumand misoprostol, diclofenac sodium topical solution 1.5%, etodolac (IR and ER), flurbiprofen, ibuprofen, indomethacin (IR and ER), ketoprofen IR 50 mg and 75 mg, ketorolac (tablets), meclofenamate, mefenamic acid, meloxicam, nabumetone, naproxen (generics for Naprelan and Naproxen Suspension are NOT preferred), oxaprozin, piroxicam, sulindac, and tolmetin (generics for tolmetin 400 mg and 600 mg are not preferred). PLEASE NOTE: Celecoxib is accepted as a generic NSAID.

## 2018-12-30 NOTE — Telephone Encounter (Signed)
Spoke with pt and advised of provider feedback and pt states he will just buy OTC.

## 2019-01-22 ENCOUNTER — Encounter: Payer: Self-pay | Admitting: Physician Assistant

## 2019-03-13 ENCOUNTER — Encounter: Payer: Self-pay | Admitting: Physician Assistant

## 2019-03-23 ENCOUNTER — Ambulatory Visit (INDEPENDENT_AMBULATORY_CARE_PROVIDER_SITE_OTHER): Payer: BC Managed Care – PPO | Admitting: Physician Assistant

## 2019-03-23 ENCOUNTER — Other Ambulatory Visit: Payer: Self-pay

## 2019-03-23 ENCOUNTER — Encounter: Payer: Self-pay | Admitting: Physician Assistant

## 2019-03-23 ENCOUNTER — Ambulatory Visit (INDEPENDENT_AMBULATORY_CARE_PROVIDER_SITE_OTHER): Payer: BC Managed Care – PPO

## 2019-03-23 DIAGNOSIS — G8929 Other chronic pain: Secondary | ICD-10-CM

## 2019-03-23 DIAGNOSIS — M25512 Pain in left shoulder: Secondary | ICD-10-CM

## 2019-03-23 NOTE — Progress Notes (Signed)
     Telephone visit  Subjective: Christopher shoulder pain left PCP: Christopher Sleeper, PA-C [name] is a 58 y.o. male calls for telephone consult today. Patient provides verbal consent for consult held via phone.  Patient is identified with 2 separate identifiers.  At this time the entire area is on COVID-19 social distancing and stay home orders are in place.  Patient is of higher risk and therefore we are performing this by a virtual method.  Location of patient: home Location of provider: HOME Others present for call: no  Patient has had many months of shoulder pain he has diligently done physical therapy type exercises 3-4 times a week.  He does not feel like things have improved very much.  The most concerning thing is pain just over the deltoid middle.  He does start to have more pain that goes down the arm.  He explains that he has some sensation of weakness but he is not dropping things.  His range of motion is also decreased.  X-ray does not show any abnormal or maladies in the bones and alignment.  We will plan for orthopedic referral.    ROS: Per HPI  Allergies  Allergen Reactions  . Levofloxacin Nausea And Vomiting   Past Medical History:  Diagnosis Date  . Acne rosacea   . DDD (degenerative disc disease), cervical   . Hyperlipidemia   . Multiple sclerosis ([place])   . Multiple sclerosis ([place])   . RLS (restless legs syndrome)     Current Outpatient Medications:  .  diclofenac sodium (VOLTAREN) 1 % GEL, Apply 4 g topically 4 (four) times daily., Disp: 200 g, Rfl: 5 .  ibuprofen (ADVIL) 800 MG tablet, TAKE 1 TABLET EVERY 8 HOURS AS NEEDED, Disp: 270 tablet, Rfl: 3 .  LINZESS 290 MCG CAPS capsule, , Disp: , Rfl: 0  Assessment/ Plan: 58 y.o. male   1. Chronic left shoulder pain - DG Shoulder Left; Future - Ambulatory referral to Orthopedic Surgery   No follow-ups on file.  Continue all other maintenance medications as listed above.  Start time: 1:48  PM End time: 2:00 PM  No orders of the defined types were placed in this encounter.   Particia Nearing PA-C [place] [phone]

## 2019-03-24 ENCOUNTER — Ambulatory Visit: Payer: BC Managed Care – PPO | Admitting: Physician Assistant

## 2019-04-03 ENCOUNTER — Encounter: Payer: Self-pay | Admitting: Physician Assistant

## 2019-04-20 DIAGNOSIS — M25512 Pain in left shoulder: Secondary | ICD-10-CM | POA: Diagnosis not present

## 2019-04-20 DIAGNOSIS — M7502 Adhesive capsulitis of left shoulder: Secondary | ICD-10-CM | POA: Diagnosis not present

## 2019-04-27 ENCOUNTER — Other Ambulatory Visit: Payer: Self-pay

## 2019-04-27 ENCOUNTER — Ambulatory Visit: Payer: BC Managed Care – PPO | Attending: Orthopedic Surgery | Admitting: Physical Therapy

## 2019-04-27 ENCOUNTER — Encounter: Payer: Self-pay | Admitting: Physical Therapy

## 2019-04-27 DIAGNOSIS — M25512 Pain in left shoulder: Secondary | ICD-10-CM | POA: Insufficient documentation

## 2019-04-27 DIAGNOSIS — G8929 Other chronic pain: Secondary | ICD-10-CM | POA: Insufficient documentation

## 2019-04-27 DIAGNOSIS — M25612 Stiffness of left shoulder, not elsewhere classified: Secondary | ICD-10-CM

## 2019-04-27 NOTE — Therapy (Signed)
[facility] [address] [place], [place], [address] Phone: [phone]   Fax:  [phone]  Physical Therapy Evaluation  Patient Details  Name: Christopher Vance MRN: [id number] Date of Birth: 09-Feb-1961 Referring Provider (PT): Ralene Bathe, [place]   Encounter Date: 04/27/2019  PT End of Session - 04/27/19 1251    Visit Number  1    Number of Visits  12    Date for PT Re-Evaluation  06/15/19    Authorization Type  FOTO every 5th visit, progress note every 10th visit    PT Start Time  1031    PT Stop Time  1115    PT Time Calculation (min)  44 min    Activity Tolerance  Patient tolerated treatment well    Behavior During Therapy  [facility] for tasks assessed/performed       Past Medical History:  Diagnosis Date  . Acne rosacea   . DDD (degenerative disc disease), cervical   . Hyperlipidemia   . Multiple sclerosis ([facility])   . Multiple sclerosis ([facility])   . RLS (restless legs syndrome)     History reviewed. No pertinent surgical history.  There were no vitals filed for this visit.   Subjective Assessment - 04/27/19 1242    Subjective  COVID-19 screening performed upon arrival. Patient arrives to physical therapy with reports of a progression of left shoulder pain and loss of range of motion that began about January 2020. Patient reports the ability to perform ADLs but limits his ability to exercise. Patient reports pain certain movements particularly with extension and abduction. Patient reports getting an injection on 04/22/2019 with minimal relief. Patient reports pain at worst as 7-8/10 and pain at best 1-05/2018. Patient's goals are to decrease pain, improve movement, and return to exercising.    Limitations  Lifting    Diagnostic tests  X-Ray: normal    Patient Stated Goals  improve motion and get back to exercising    Currently in Pain?  Yes    Pain Score  3     Pain Location  Shoulder    Pain Orientation  Left    Pain Descriptors  / Indicators  Sore    Pain Type  Chronic pain    Pain Radiating Towards  up upper trap and just below elbow.    Pain Onset  More than a month ago    Pain Frequency  Constant    Aggravating Factors   movements in the wrong direction    Pain Relieving Factors  "pain is always there"    Effect of Pain on Daily Activities  "very slightly, limits exercise"         [facility] PT Assessment - 04/27/19 0001      Assessment   Medical Diagnosis  Adhesive Capsulitis of left shoulder    Referring Provider (PT)  Ralene Bathe, PA-C    Onset Date/Surgical Date  --   ~January/February 2020   Hand Dominance  Right    Next MD Visit  06/01/2019    Prior Therapy  no      Precautions   Precautions  None      Restrictions   Weight Bearing Restrictions  No      Balance Screen   Has the patient fallen in the past 6 months  No    Has the patient had a decrease in activity level because of a fear of falling?   No    Is the patient reluctant to leave their  home because of a fear of falling?   No      Home Public house managernvironment   Living Environment  Private residence      Prior Function   Level of Independence  Independent    Leisure  Exercising      Observation/Other Assessments   Focus on Therapeutic Outcomes (FOTO)   32% limitation      Posture/Postural Control   Posture/Postural Control  Postural limitations    Postural Limitations  Rounded Shoulders;Forward head   bilateral scapular winging     ROM / Strength   AROM / PROM / Strength  AROM;PROM      AROM   Overall AROM   Deficits    AROM Assessment Site  Shoulder    Right/Left Shoulder  Left    Left Shoulder Flexion  118 Degrees    Left Shoulder ABduction  98 Degrees    Left Shoulder Internal Rotation  --   to abdomen   Left Shoulder External Rotation  32 Degrees      PROM   Overall PROM   Deficits    PROM Assessment Site  Shoulder    Right/Left Shoulder  Left    Left Shoulder Flexion  140 Degrees    Left Shoulder ABduction  100 Degrees     Left Shoulder Internal Rotation  54 Degrees    Left Shoulder External Rotation  32 Degrees      Palpation   Palpation comment  Very tender to palpation to left bicipital groove, minimal to left coracoid process and posterior shoulder and deltoid region      Special Tests    Special Tests  Rotator Cuff Impingement    Rotator Cuff Impingment tests  Leanord AsalHawkins- Kennedy test      Hawkins-Kennedy test   Findings  Positive    Side  Left    Comments  increase of pain                Objective measurements completed on examination: See above findings.              PT Education - 04/27/19 1251    Education Details  AAROM ER, flexion, scapular retraction, AAROM extension    Person(s) Educated  Patient    Methods  Explanation;Demonstration;Handout    Comprehension  Returned demonstration;Verbalized understanding       PT Short Term Goals - 04/27/19 1351      PT SHORT TERM GOAL #1   Title  STG=LTG        PT Long Term Goals - 04/27/19 1351      PT LONG TERM GOAL #1   Title  Patient will be independent with HEP.    Time  6    Period  Weeks    Status  New      PT LONG TERM GOAL #2   Title  Patient will demonstrate 55+ degrees of left shoulder ER AROM to improve donning and doffing apparel    Time  6    Period  Weeks    Status  New      PT LONG TERM GOAL #3   Title  Patient will report ability to perform ADLs with left shoulder pain less than or equal to 3/10.    Time  6    Period  Weeks    Status  New      PT LONG TERM GOAL #4   Title  Patient will demonstarte 140+ degrees of left shoulder flexion AROM  to improve ability to peform overhead tasks.    Time  6    Period  Weeks    Status  New      PT LONG TERM GOAL #5   Title  Patient will demonstrate 4+/5 or greater left shoulder MMT in all planes to improve stability during functional tasks.    Time  6    Period  Weeks    Status  New      Additional Long Term Goals   Additional Long Term Goals   Yes      PT LONG TERM GOAL #6   Title  Patient will return to exercise program with left shoulder pain less than or equal to 2/10.    Time  6    Period  Weeks    Status  New             Plan - 04/27/19 1409    Clinical Impression Statement  Patient arrives to physical therapy with reports of a progression of left shoulder pain and loss of range of motion that began about January 2020. Patient reports the ability to perform ADLs but limits his ability to exercise. Patient reports pain certain movements particularly with extension and abduction. Patient reports getting an injection on 04/22/2019 with minimal relief. Patient reports pain at worst as 7-8/10 and pain at best 1-05/2018. Patient's goals are to decrease pain, improve movement, and return to exercising.    Personal Factors and Comorbidities  Fitness    Examination-Activity Limitations  Carry;Lift    Stability/Clinical Decision Making  Stable/Uncomplicated    Clinical Decision Making  Low    Rehab Potential  Excellent    PT Frequency  2x / week    PT Duration  6 weeks    PT Treatment/Interventions  ADLs/Self Care Home Management;Iontophoresis 4mg /ml Dexamethasone;Ultrasound;Moist Heat;Cryotherapy;Electrical Stimulation;Therapeutic exercise;Neuromuscular re-education;Therapeutic activities;Patient/family education;Taping;Vasopneumatic Device;Passive range of motion;Manual techniques;Dry needling    PT Next Visit Plan  UBE, AAROM in various positions, PROM, left shoulder stretching, modalities PRN for pain relief.    PT Home Exercise Plan  see patient education section    Consulted and Agree with Plan of Care  Patient       Patient will benefit from skilled therapeutic intervention in order to improve the following deficits and impairments:  Decreased activity tolerance, Decreased strength, Decreased range of motion, Pain, Impaired UE functional use, Postural dysfunction  Visit Diagnosis: Stiffness of left shoulder, not  elsewhere classified  Chronic left shoulder pain     Problem List Patient Active Problem List   Diagnosis Date Noted  . Chronic left shoulder pain 12/25/2018  . White matter abnormality on MRI of brain 06/03/2015  . Relapsing remitting multiple sclerosis ([place]) 02/21/2015  . Migraine with aura and without status migrainosus, not intractable 02/21/2015    Gabriela Eves, PT, DPT 04/27/2019, 2:16 PM  [facility] [address] [place], [place], [address] Phone: [phone]   Fax:  [phone]  Name: Christopher Vance MRN: [id number] Date of Birth: 11-11-1960

## 2019-04-30 ENCOUNTER — Ambulatory Visit: Payer: BC Managed Care – PPO | Admitting: Physical Therapy

## 2019-06-01 DIAGNOSIS — M7502 Adhesive capsulitis of left shoulder: Secondary | ICD-10-CM | POA: Diagnosis not present

## 2019-06-03 DIAGNOSIS — U071 COVID-19: Secondary | ICD-10-CM | POA: Diagnosis not present

## 2019-06-03 DIAGNOSIS — Z03818 Encounter for observation for suspected exposure to other biological agents ruled out: Secondary | ICD-10-CM | POA: Diagnosis not present

## 2019-06-03 DIAGNOSIS — Z20828 Contact with and (suspected) exposure to other viral communicable diseases: Secondary | ICD-10-CM | POA: Diagnosis not present

## 2019-11-12 ENCOUNTER — Other Ambulatory Visit: Payer: Self-pay | Admitting: *Deleted

## 2019-11-27 ENCOUNTER — Other Ambulatory Visit: Payer: Self-pay | Admitting: *Deleted

## 2019-11-27 MED ORDER — IBUPROFEN 800 MG PO TABS: 800.0000 mg | ORAL_TABLET | Freq: Three times a day (TID) | ORAL | 0 refills | Status: DC | PRN

## 2019-12-03 ENCOUNTER — Other Ambulatory Visit: Payer: Self-pay

## 2019-12-03 ENCOUNTER — Ambulatory Visit (INDEPENDENT_AMBULATORY_CARE_PROVIDER_SITE_OTHER): Payer: BC Managed Care – PPO | Admitting: Nurse Practitioner

## 2019-12-03 ENCOUNTER — Encounter: Payer: Self-pay | Admitting: Nurse Practitioner

## 2019-12-03 VITALS — BP 103/55 | HR 66 | Temp 98.4°F | Resp 20 | Ht 70.0 in | Wt 139.0 lb

## 2019-12-03 DIAGNOSIS — G43109 Migraine with aura, not intractable, without status migrainosus: Secondary | ICD-10-CM

## 2019-12-03 DIAGNOSIS — L709 Acne, unspecified: Secondary | ICD-10-CM | POA: Diagnosis not present

## 2019-12-03 DIAGNOSIS — K5901 Slow transit constipation: Secondary | ICD-10-CM | POA: Diagnosis not present

## 2019-12-03 DIAGNOSIS — K5900 Constipation, unspecified: Secondary | ICD-10-CM | POA: Insufficient documentation

## 2019-12-03 MED ORDER — RIZATRIPTAN BENZOATE 10 MG PO TBDP: 10.0000 mg | ORAL_TABLET | ORAL | 0 refills | Status: AC | PRN

## 2019-12-03 MED ORDER — DOXYCYCLINE HYCLATE 100 MG PO TABS: 100.0000 mg | ORAL_TABLET | Freq: Two times a day (BID) | ORAL | 0 refills | Status: DC

## 2019-12-03 NOTE — Progress Notes (Signed)
Subjective:    Patient ID: Christopher Vance, male    DOB: 30-Jul-1960, 59 y.o.   MRN: [id number]   Chief Complaint: Medical Management of Chronic Issues    HPI:  1. Migraine with aura and without status migrainosus, not intractable He has along history of migraines. He says he one around 1x a month. The maxalt works well for heim. He has an aura and can usually stop it before it gets bad.  2. Slow transit constipation He has linzess avialble if he needs ot. He has not needed to take it as of late.     Outpatient Encounter Medications as of 12/03/2019  Medication Sig  . ibuprofen (ADVIL) 800 MG tablet Take 1 tablet (800 mg total) by mouth every 8 (eight) hours as needed.  Marland Kitchen LINZESS 290 MCG CAPS capsule     History reviewed. No pertinent surgical history.  Family History  Problem Relation Age of Onset  . Hypertension Mother   . Heart disease Mother   . Dementia Father   . Heart disease Father     New complaints: He has flare up of adult acne. It always occurson his nose and when it flares up he would take doxycycline100mg  and that would clear it up.  Social history: Lives by hisself.  Controlled substance contract: n/a     Review of Systems  Constitutional: Negative for diaphoresis.  Eyes: Negative for pain.  Respiratory: Negative for shortness of breath.   Cardiovascular: Negative for chest pain, palpitations and leg swelling.  Gastrointestinal: Negative for abdominal pain.  Endocrine: Negative for polydipsia.  Skin: Negative for rash.  Neurological: Negative for dizziness, weakness and headaches.  Hematological: Does not bruise/bleed easily.  All other systems reviewed and are negative.      Objective:   Physical Exam Vitals and nursing note reviewed.  Constitutional:      Appearance: Normal appearance. He is well-developed.  HENT:     Head: Normocephalic.     Nose: Nose normal.  Eyes:     Pupils: Pupils are equal, round, and reactive to light.    Neck:     Thyroid: No thyroid mass or thyromegaly.     Vascular: No carotid bruit or JVD.     Trachea: Phonation normal.  Cardiovascular:     Rate and Rhythm: Normal rate and regular rhythm.  Pulmonary:     Effort: Pulmonary effort is normal. No respiratory distress.     Breath sounds: Normal breath sounds.  Abdominal:     General: Bowel sounds are normal.     Palpations: Abdomen is soft.     Tenderness: There is no abdominal tenderness.  Musculoskeletal:        General: Normal range of motion.     Cervical back: Normal range of motion and neck supple.  Lymphadenopathy:     Cervical: No cervical adenopathy.  Skin:    General: Skin is warm and dry.     Comments: Erythematous all around nares. No actual lesions noted  Neurological:     Mental Status: He is alert and oriented to person, place, and time.  Psychiatric:        Behavior: Behavior normal.        Thought Content: Thought content normal.        Judgment: Judgment normal.    BP (!) 103/55   Pulse 66   Temp 98.4 F (36.9 C) (Temporal)   Resp 20   Ht 5\' 10"  (1.778 m)   Wt  139 lb (63 kg)   SpO2 98%   BMI 19.94 kg/m        Assessment & Plan:  Christopher Vance comes in today with chief complaint of Medical Management of Chronic Issues   Diagnosis and orders addressed:  1. Migraine with aura and without status migrainosus, not intractable Avoid caffeine - rizatriptan (MAXALT-MLT) 10 MG disintegrating tablet; Take 1 tablet (10 mg total) by mouth as needed for migraine. May repeat in 2 hours if needed  Dispense: 10 tablet; Refill: 0  2. Slow transit constipation Use linzess as needed  3. Adult acne Do not pick or scrath - doxycycline (VIBRA-TABS) 100 MG tablet; Take 1 tablet (100 mg total) by mouth 2 (two) times daily. 1 po bid  Dispense: 20 tablet; Refill: 0   Follow up plan: Prn    Christopher Daphine Deutscher, FNP

## 2019-12-03 NOTE — Patient Instructions (Signed)

## 2019-12-29 ENCOUNTER — Ambulatory Visit
Admission: EM | Admit: 2019-12-29 | Discharge: 2019-12-29 | Disposition: A | Discharge status: Home / Self Care | Payer: BC Managed Care – PPO | Attending: Emergency Medicine | Admitting: Emergency Medicine

## 2019-12-29 ENCOUNTER — Other Ambulatory Visit: Payer: Self-pay

## 2019-12-31 LAB — NOVEL CORONAVIRUS, NAA: SARS-CoV-2, NAA: DETECTED — AB

## 2020-03-16 ENCOUNTER — Other Ambulatory Visit: Payer: Self-pay | Admitting: Nurse Practitioner

## 2020-03-16 DIAGNOSIS — L709 Acne, unspecified: Secondary | ICD-10-CM

## 2020-03-19 ENCOUNTER — Other Ambulatory Visit: Payer: Self-pay

## 2020-03-19 DIAGNOSIS — L709 Acne, unspecified: Secondary | ICD-10-CM

## 2020-03-21 ENCOUNTER — Telehealth: Payer: Self-pay

## 2020-03-21 DIAGNOSIS — L709 Acne, unspecified: Secondary | ICD-10-CM

## 2020-03-22 NOTE — Telephone Encounter (Signed)
He was seen in office on 12/03/19 and was given a short 10 day course of antibiotics for acne. We do not give refills on antibiotics, especially if it has been a while since patient was seen. Sorry butr this is our office policy.

## 2020-03-22 NOTE — Telephone Encounter (Signed)
Patient rudely states that Christopher Vance needs to read files because he is not on this medication due to a sickness and that she knows what it is for. He also refuses to be seen stating that hes not playing these games and if it did not get filled he will go somewhere else

## 2020-03-22 NOTE — Telephone Encounter (Signed)
Ntbs for antibiotic refill

## 2020-03-22 NOTE — Telephone Encounter (Signed)
Pt called for a refill on his Doxycycline for his acne to [facility] [place] pharmacy

## 2020-03-22 NOTE — Telephone Encounter (Signed)
Made pt aware of MMMs note and offered him an appt to be seen tomorrow to get a refill on his antibiotic for his acne. Pt declined appt and said that he should not have to be seen just to get a refill on his acne medicine because its not like it is a controlled Rx and he doesn't abuse taking the medicine. Pt said if MMM cant reconsider sending in a refill then he will be looking for to establish care elsewhere.

## 2020-03-23 ENCOUNTER — Ambulatory Visit: Payer: BC Managed Care – PPO | Admitting: Family Medicine

## 2020-05-16 DIAGNOSIS — Z1152 Encounter for screening for COVID-19: Secondary | ICD-10-CM

## 2020-05-24 ENCOUNTER — Emergency Department (HOSPITAL_COMMUNITY): Payer: BC Managed Care – PPO

## 2020-05-24 ENCOUNTER — Other Ambulatory Visit: Payer: Self-pay

## 2020-05-24 ENCOUNTER — Encounter (HOSPITAL_COMMUNITY): Payer: Self-pay

## 2020-05-24 ENCOUNTER — Emergency Department (HOSPITAL_COMMUNITY)
Admission: EM | Admit: 2020-05-24 | Discharge: 2020-05-24 | Disposition: A | Discharge status: Home / Self Care | Payer: BC Managed Care – PPO | Attending: Emergency Medicine | Admitting: Emergency Medicine

## 2020-05-24 DIAGNOSIS — R110 Nausea: Secondary | ICD-10-CM | POA: Diagnosis not present

## 2020-05-24 DIAGNOSIS — Z20822 Contact with and (suspected) exposure to covid-19: Secondary | ICD-10-CM | POA: Diagnosis not present

## 2020-05-24 DIAGNOSIS — J029 Acute pharyngitis, unspecified: Secondary | ICD-10-CM | POA: Insufficient documentation

## 2020-05-24 DIAGNOSIS — R61 Generalized hyperhidrosis: Secondary | ICD-10-CM | POA: Diagnosis not present

## 2020-05-24 DIAGNOSIS — R0789 Other chest pain: Secondary | ICD-10-CM | POA: Diagnosis not present

## 2020-05-24 DIAGNOSIS — I700 Atherosclerosis of aorta: Secondary | ICD-10-CM | POA: Diagnosis not present

## 2020-05-24 DIAGNOSIS — R0602 Shortness of breath: Secondary | ICD-10-CM | POA: Diagnosis not present

## 2020-05-24 DIAGNOSIS — N281 Cyst of kidney, acquired: Secondary | ICD-10-CM | POA: Diagnosis not present

## 2020-05-24 DIAGNOSIS — R079 Chest pain, unspecified: Secondary | ICD-10-CM | POA: Diagnosis not present

## 2020-05-24 LAB — BASIC METABOLIC PANEL
Anion gap: 7 (ref 5–15)
BUN: 20 mg/dL (ref 6–20)
CO2: 27 mmol/L (ref 22–32)
Calcium: 9.4 mg/dL (ref 8.9–10.3)
Chloride: 104 mmol/L (ref 98–111)
Creatinine, Ser: 1.13 mg/dL (ref 0.61–1.24)
GFR, Estimated: >60 mL/min (ref >60)
Glucose, Bld: 96 mg/dL (ref 70–99)
Potassium: 4.6 mmol/L (ref 3.5–5.1)
Sodium: 138 mmol/L (ref 135–145)

## 2020-05-24 LAB — CBC WITH DIFFERENTIAL/PLATELET
Abs Immature Granulocytes: 0.01 10*3/uL (ref 0.00–0.07)
Basophils Absolute: 0 10*3/uL (ref 0.0–0.1)
Basophils Relative: 0 %
Eosinophils Absolute: 0 10*3/uL (ref 0.0–0.5)
Eosinophils Relative: 0 %
HCT: 40 % (ref 39.0–52.0)
Hemoglobin: 14 g/dL (ref 13.0–17.0)
Immature Granulocytes: 0 %
Lymphocytes Relative: 18 %
Lymphs Abs: 1.2 10*3/uL (ref 0.7–4.0)
MCH: 33.2 pg (ref 26.0–34.0)
MCHC: 35 g/dL (ref 30.0–36.0)
MCV: 94.8 fL (ref 80.0–100.0)
Monocytes Absolute: 0.8 10*3/uL (ref 0.1–1.0)
Monocytes Relative: 11 %
Neutro Abs: 4.7 10*3/uL (ref 1.7–7.7)
Neutrophils Relative %: 71 %
Platelets: 207 10*3/uL (ref 150–400)
RBC: 4.22 MIL/uL (ref 4.22–5.81)
RDW: 11.9 % (ref 11.5–15.5)
WBC: 6.8 10*3/uL (ref 4.0–10.5)
nRBC: 0 % (ref 0.0–0.2)

## 2020-05-24 LAB — GROUP A STREP BY PCR: Group A Strep by PCR: NOT DETECTED

## 2020-05-24 LAB — TROPONIN I (HIGH SENSITIVITY)
Troponin I (High Sensitivity): 3 ng/L (ref <18)
Troponin I (High Sensitivity): 3 ng/L (ref <18)

## 2020-05-24 LAB — D-DIMER, QUANTITATIVE: D-Dimer, Quant: <0.27 ug/mL-FEU (ref 0.00–0.50)

## 2020-05-24 LAB — POC SARS CORONAVIRUS 2 AG -  ED: SARS Coronavirus 2 Ag: NEGATIVE

## 2020-05-24 MED ORDER — ASPIRIN 81 MG PO CHEW
243.0000 mg | CHEWABLE_TABLET | Freq: Once | ORAL | Status: AC
  Administered 2020-05-24: 243 mg via ORAL
  Filled 2020-05-24: qty 3

## 2020-05-24 MED ORDER — IOHEXOL 350 MG/ML SOLN
100.0000 mL | Freq: Once | INTRAVENOUS | Status: AC | PRN
  Administered 2020-05-24: 100 mL via INTRAVENOUS

## 2020-05-24 NOTE — ED Triage Notes (Addendum)
Pt presents to ED, states he feels like something is squeezing his throat, pressure in his chest down right arm, headache started at 0500, denies fever. Pt denies any difficulty swallowing.

## 2020-05-24 NOTE — ED Provider Notes (Signed)
[facility] EMERGENCY DEPARTMENT Provider Note   CSN: [id number] Arrival date & time: 05/24/20  6010     History Chief Complaint  Patient presents with  . Sore Throat  . Chest Pain    Christopher Vance is a 60 y.o. male with PMHx HLD, Multiple sclerosis (off of medication for 8 years), and RLS who presents to the ED today with complaint of sore throat/chest pain.   Pt reports that for the past 4 days he has had a "squeezing" pain in his throat. He did not think much of it because he does not like going to the doctors to be evaluated. He woke up this morning feeling fatigued with mild worsening of his throat pain when he went to exercise like he does every morning. Pt reports that while working out he began having a worsening feeling of throat pain as well as right sided chest pain that felt like a squeezing sensation radiating down his right arm and into his head. Pt felt like he became very clammy and cold with the chest pain. He took 1 baby aspirin and tried to do breathing exercises as he wasn't sure if he was having a panic attack or not - he states he has a long history with panic attacks but he has never had pain this severe. He was speaking with his daughter afterwards who convinced him to come to the ED for further evaluation. Pt reports both his father and uncle had heart attacks in their 31s. He is very health conscious and tries to exercise regularly to prevent any issues with his heart. No previous history of MI for pt himself. He typically does not have any chest pain with exercise. Pt reports the pain has somewhat subsided since being in the ED - he rates it currently a 4/10 vs a 10/10 earlier today. Pt is a never smoker. No hx DVT/PE. No recent prolonged travel or immobilization. No hemoptysis. No active malignancy. No exogenous hormone use. No hemoptysis.   Pt denies any recent sick contacts. He is vaccinated x 2 with most recent dose in September/October. Denies fevers, chills,  difficulty swallowing, shortness of breath, vomiting, cough, abdominal pain, rash, or any other associated symptoms.   The history is provided by the patient and medical records.       Past Medical History:  Diagnosis Date  . Acne rosacea   . DDD (degenerative disc disease), cervical   . Hyperlipidemia   . Multiple sclerosis (HCC)   . Multiple sclerosis (HCC)   . RLS (restless legs syndrome)     Patient Active Problem List   Diagnosis Date Noted  . Constipation 12/03/2019  . Chronic left shoulder pain 12/25/2018  . White matter abnormality on MRI of brain 06/03/2015  . Relapsing remitting multiple sclerosis (HCC) 02/21/2015  . Migraine with aura and without status migrainosus, not intractable 02/21/2015    History reviewed. No pertinent surgical history.     Family History  Problem Relation Age of Onset  . Hypertension Mother   . Heart disease Mother   . Dementia Father   . Heart disease Father     Social History   Tobacco Use  . Smoking status: Never Smoker  . Smokeless tobacco: Never Used  Vaping Use  . Vaping Use: Never used  Substance Use Topics  . Alcohol use: No  . Drug use: No    Home Medications Prior to Admission medications   Medication Sig Start Date End Date Taking? Authorizing Provider  doxycycline (VIBRA-TABS) 100 MG tablet Take 1 tablet (100 mg total) by mouth 2 (two) times daily. 1 po bid Patient not taking: Reported on 05/24/2020 12/03/19   Bennie Pierini, FNP  ibuprofen (ADVIL) 800 MG tablet Take 1 tablet (800 mg total) by mouth every 8 (eight) hours as needed. Patient not taking: Reported on 05/24/2020 11/27/19   Raliegh Ip, DO  rizatriptan (MAXALT-MLT) 10 MG disintegrating tablet Take 1 tablet (10 mg total) by mouth as needed for migraine. May repeat in 2 hours if needed Patient not taking: Reported on 05/24/2020 12/03/19   Bennie Pierini, FNP    Allergies    Levofloxacin  Review of Systems   Review of Systems   Constitutional: Positive for diaphoresis. Negative for chills and fever.  HENT: Positive for sore throat. Negative for trouble swallowing and voice change.   Eyes: Positive for photophobia. Negative for visual disturbance.  Cardiovascular: Positive for chest pain.  Gastrointestinal: Positive for nausea. Negative for vomiting.  Neurological: Positive for headaches. Negative for weakness and numbness.  All other systems reviewed and are negative.   Physical Exam Updated Vital Signs BP 126/76 (BP Location: Right Arm)   Pulse 88   Temp 98.1 F (36.7 C) (Oral)   Resp 18   Ht 5\' 10"  (1.778 m)   Wt 63.5 kg   SpO2 100%   BMI 20.09 kg/m   Physical Exam Vitals and nursing note reviewed.  Constitutional:      Appearance: He is not ill-appearing or diaphoretic.  HENT:     Head: Normocephalic and atraumatic.     Mouth/Throat:     Mouth: Mucous membranes are moist.     Pharynx: Uvula midline. Posterior oropharyngeal erythema present. No oropharyngeal exudate.  Eyes:     Conjunctiva/sclera: Conjunctivae normal.     Pupils: Pupils are equal, round, and reactive to light.  Cardiovascular:     Rate and Rhythm: Normal rate and regular rhythm.     Heart sounds: Normal heart sounds.  Pulmonary:     Effort: Pulmonary effort is normal.     Breath sounds: Normal breath sounds. No wheezing, rhonchi or rales.  Chest:     Chest wall: No tenderness.  Abdominal:     Palpations: Abdomen is soft.     Tenderness: There is no abdominal tenderness. There is no guarding or rebound.  Musculoskeletal:     Cervical back: Neck supple.  Skin:    General: Skin is warm and dry.  Neurological:     Mental Status: He is alert.     Comments: Alert and oriented to self, place, time and event.   Speech is fluent, clear without dysarthria or dysphasia.   Strength 5/5 in upper/lower extremities  Sensation intact in upper/lower extremities   Normal gait.  Negative Romberg. No pronator drift.  Normal  finger-to-nose and feet tapping.  CN I not tested  CN II grossly intact visual fields bilaterally. Did not visualize posterior eye.   CN III, IV, VI PERRLA and EOMs intact bilaterally  CN V Intact sensation to sharp and light touch to the face  CN VII facial movements symmetric  CN VIII not tested  CN IX, X no uvula deviation, symmetric rise of soft palate  CN XI 5/5 SCM and trapezius strength bilaterally  CN XII Midline tongue protrusion, symmetric L/R movements      ED Results / Procedures / Treatments   Labs (all labs ordered are listed, but only abnormal results are displayed) Labs Reviewed  GROUP A STREP BY PCR  SARS CORONAVIRUS 2 (TAT 6-24 HRS)  BASIC METABOLIC PANEL  CBC WITH DIFFERENTIAL/PLATELET  D-DIMER, QUANTITATIVE (NOT AT [facility])  POC SARS CORONAVIRUS 2 AG -  ED  TROPONIN I (HIGH SENSITIVITY)  TROPONIN I (HIGH SENSITIVITY)    EKG EKG Interpretation  Date/Time:  Tuesday May 24 2020 11:10:57 EST Ventricular Rate:  61 PR Interval:    QRS Duration: 105 QT Interval:  409 QTC Calculation: 412 R Axis:   87 Text Interpretation: Sinus rhythm RSR' in V1 or V2, right VCD or RVH Baseline wander in lead(s) V3 No old tracing to compare Confirmed by Mancel Bale [phone]) on 05/24/2020 11:17:02 AM   Radiology DG Chest Port 1 View  Result Date: 05/24/2020 CLINICAL DATA:  Chest pain EXAM: PORTABLE CHEST 1 VIEW COMPARISON:  None. FINDINGS: Lungs are clear. Heart size and pulmonary vascularity are normal. No adenopathy. No pneumothorax. No bone lesions. IMPRESSION: Lungs clear.  Cardiac silhouette normal. Electronically Signed   By: Bretta Bang III M.D.   On: 05/24/2020 10:31   CT Angio Chest/Abd/Pel for Dissection W and/or W/WO  Result Date: 05/24/2020 CLINICAL DATA:  Chest pressure radiating down right arm, throat squeezing EXAM: CT ANGIOGRAPHY CHEST, ABDOMEN AND PELVIS TECHNIQUE: Non-contrast CT of the chest was initially obtained. Multidetector CT imaging through  the chest, abdomen and pelvis was performed using the standard protocol during bolus administration of intravenous contrast. Multiplanar reconstructed images and MIPs were obtained and reviewed to evaluate the vascular anatomy. CONTRAST:  OMNIPAQUE IOHEXOL 350 MG/ML SOLN COMPARISON:  None. FINDINGS: CTA CHEST FINDINGS Cardiovascular: No evidence of thoracic aorta intramural hematoma or dissection. Caliber is normal. Heart size is normal. There is no pericardial effusion. There is no central pulmonary embolism. Mediastinum/Nodes: No enlarged lymph nodes. The thyroid and esophagus are unremarkable. Lungs/Pleura: No consolidation or mass. No pleural effusion or pneumothorax. Musculoskeletal: No acute osseous abnormality. Review of the MIP images confirms the above findings. CTA ABDOMEN AND PELVIS FINDINGS VASCULAR Aorta: Normal caliber aorta without aneurysm, dissection, vasculitis or significant stenosis. Minor calcified plaque. Celiac: Patent without stenosis SMA: Patent without stenosis. Renals: Patent without stenosis. IMA: Patent without stenosis. Inflow: Patent. Minimal calcified plaque along the left common iliac. Veins: Not evaluated. Review of the MIP images confirms the above findings. NON-VASCULAR Hepatobiliary: No focal liver abnormality is seen. No gallstones, gallbladder wall thickening, or biliary dilatation. Pancreas: Unremarkable. No pancreatic ductal dilatation or surrounding inflammatory changes. Spleen: Normal in size without focal abnormality. Adrenals/Urinary Tract: Small cyst of the upper pole of the right kidney. Left kidney is unremarkable. Adrenals and bladder are unremarkable. Stomach/Bowel: Stomach is within normal limits. Bowel is normal in caliber. Normal appendix. Lymphatic: No enlarged lymph nodes identified. Reproductive: Unremarkable. Other: No ascites.  No abdominal wall abnormality. Musculoskeletal: No acute osseous abnormality. Review of the MIP images confirms the above  findings. IMPRESSION: No evidence of aortic dissection or other acute abnormality. Electronically Signed   By: Guadlupe Spanish M.D.   On: 05/24/2020 14:18    Procedures Procedures   Medications Ordered in ED Medications  aspirin chewable tablet 243 mg (243 mg Oral Given 05/24/20 1105)  iohexol (OMNIPAQUE) 350 MG/ML injection 100 mL (100 mLs Intravenous Contrast Given 05/24/20 1359)    ED Course  I have reviewed the triage vital signs and the nursing notes.  Pertinent labs & imaging results that were available during my care of the patient were reviewed by me and considered in my medical decision making (see chart  for details).  Clinical Course as of 05/24/20 1442  Tue May 24, 2020  1148 D-Dimer, Quant: <0.27 [MV]    Clinical Course User Index [MV] Tanda Rockers, PA-C   MDM Rules/Calculators/A&P                          60 year old male who presents to the ED today complaining of sore throat for the past 4 days with new onset right-sided chest pain radiating down his right arm with associated diaphoresis, nausea, shortness of breath began this morning around 5 AM while exercising.  On arrival to the ED vitals are stable.  Patient is afebrile, nontachycardic nontachypneic.  He was triaged as a sore throat and therefore no EKG or other lab work was obtained for approximately 2 hours.  When he is brought back to the room he reports his main concern was his chest pain today.  He does have a positive family history of CAD at the ages of 19.  Patient is still currently having chest pain rated at a 4 out of 10.  He did take 1 baby aspirin prior to arrival, will give additional 243 mg.  Will work-up for chest pain at this time with EKG, chest x-ray, troponin.  He does report some mild pleuritic type chest pain as well however he has no other risk factors besides age for PE.  Will obtain D-dimer and assess risk.  He does report he had a tingling down his right arm however no weakness.  He did have a  headache as well with the chest pain.  Given this may proceed with a dissection study once we get his creatinine back.  He does have equal pulses bilaterally; lower suspicion for dissection however in the setting of chest pain with tingling down his right arm and a headache feel this needs to be ruled out.  He has a history of migraines and states this feels different.  He has no focal neuro deficits on exam today I don't feel like he needs a CT scan of his head at this time.  Given his sore throat and other constellation of symptoms we'll plan to swab for strep as well as Covid at this time.  He denies any obvious recent sick contacts and is vaccinated x2.  Will continue to monitor in the ED.   EKG without acute ischemic changes; no previous to compare to CXR without any cardiac enlargement or other abnormalities  POC covid negative CBC without leukocytosis. Hgb stable at 14.0 BMP without electrolyte abnormalities Troponin 3; will plan to repeat given onset of chest pain today. Pt's heart score currently a 5 and requires repeat troponin for risk stratification D dimer < 0.27; do not feel pts needs PE study at this time however will proceed with dissection study.   CTA negative Repeat troponin unchanged at 3. Heart score recommends either medical admission vs outpatient follow up. On reevaluation pt resting comfortably. Reports his pain is significantly improved; he reports he is more so having throat pain now. Will do additional COVID swab at this time for send out and have pt await results. Will also place ambulatory referral to cardiology given risk factors and family history. Pt is in agreement with plan at this time and stable for discharge home.   This note was prepared using Dragon voice recognition software and may include unintentional dictation errors due to the inherent limitations of voice recognition software.  Christopher Vance was evaluated  in Emergency Department on 05/24/2020 for the  symptoms described in the history of present illness. He was evaluated in the context of the global COVID-19 pandemic, which necessitated consideration that the patient might be at risk for infection with the SARS-CoV-2 virus that causes COVID-19. Institutional protocols and algorithms that pertain to the evaluation of patients at risk for COVID-19 are in a state of rapid change based on information released by regulatory bodies including the CDC and federal and state organizations. These policies and algorithms were followed during the patient's care in the ED.  Final Clinical Impression(s) / ED Diagnoses Final diagnoses:  Sore throat  Nonspecific chest pain    Rx / DC Orders ED Discharge Orders         Ordered    Ambulatory referral to Cardiology        05/24/20 1440           Discharge Instructions     Your workup was reassuring at this time. We have sent out an additional COVID swab given your sore throat - please await results. We will call you if you test positive. You can also check MyChart for your results. If positive please self isolate for 5 days and if improvement in symptoms on Day 6 then you can resume normal daily activity with 5 additional days of wearing a mask when around others.   I have placed a referral to cardiology given your chest pain and family history - they will call you to schedule an appointment.   Please also follow up with your PCP regarding your ED visit today  Return to the ED IMMEDIATELY for any worsening symptoms including worsening pain, shortness of breath, weakness/numbness on one side of your body, passing out,  or any other new/concerning symptoms.        Tanda RockersVenter, Anterio Scheel, PA-C 05/24/20 1442    Mancel BaleWentz, Elliott, MD 05/25/20 [phone]

## 2020-05-24 NOTE — Discharge Instructions (Signed)
Your workup was reassuring at this time. We have sent out an additional COVID swab given your sore throat - please await results. We will call you if you test positive. You can also check MyChart for your results. If positive please self isolate for 5 days and if improvement in symptoms on Day 6 then you can resume normal daily activity with 5 additional days of wearing a mask when around others.   I have placed a referral to cardiology given your chest pain and family history - they will call you to schedule an appointment.   Please also follow up with your PCP regarding your ED visit today  Return to the ED IMMEDIATELY for any worsening symptoms including worsening pain, shortness of breath, weakness/numbness on one side of your body, passing out,  or any other new/concerning symptoms.

## 2020-05-24 NOTE — ED Notes (Signed)
Pt transported to CT by CT staff via stretcher at this time.  

## 2020-05-24 NOTE — ED Notes (Signed)

## 2020-05-25 LAB — SARS CORONAVIRUS 2 (TAT 6-24 HRS): SARS Coronavirus 2: NEGATIVE

## 2020-05-26 ENCOUNTER — Other Ambulatory Visit: Payer: Self-pay | Admitting: Nurse Practitioner

## 2020-05-26 DIAGNOSIS — Z20822 Contact with and (suspected) exposure to covid-19: Secondary | ICD-10-CM

## 2020-07-06 ENCOUNTER — Encounter: Payer: Self-pay | Admitting: Cardiology

## 2020-07-06 ENCOUNTER — Ambulatory Visit (INDEPENDENT_AMBULATORY_CARE_PROVIDER_SITE_OTHER): Payer: BC Managed Care – PPO | Admitting: Cardiology

## 2020-07-06 VITALS — BP 110/64 | HR 56 | Ht 70.0 in | Wt 145.4 lb

## 2020-07-06 DIAGNOSIS — R0789 Other chest pain: Secondary | ICD-10-CM

## 2020-07-06 NOTE — Progress Notes (Signed)
Clinical Summary Christopher Vance is a 60 y.o.male  Seen as new patient for the following medical problems.   1. Chest pain - Jan 2022 ER visit with throat and chest pain - trops neg, EKG NSR no acute ischemic changes, CTA chest no aortic pathology,   - started early that AM. Pressure right chest into right arm. 9/10 in severity. Felt SOB. Worst with movement. Constant pain over roughly 8 hours, though varied in severity. Some difficultly swallowing.  - mild infrequent symptoms since then - walks treadmil 6 days a week up to 20 min to 1 hour, fastwalk to job.   CAD risk factors: father and brother MI in there 59s     Past Medical History:  Diagnosis Date  . Acne rosacea   . DDD (degenerative disc disease), cervical   . Hyperlipidemia   . Multiple sclerosis (HCC)   . Multiple sclerosis (HCC)   . RLS (restless legs syndrome)      Allergies  Allergen Reactions  . Levofloxacin Nausea And Vomiting     Current Outpatient Medications  Medication Sig Dispense Refill  . doxycycline (VIBRA-TABS) 100 MG tablet Take 1 tablet (100 mg total) by mouth 2 (two) times daily. 1 po bid (Patient not taking: Reported on 05/24/2020) 20 tablet 0  . ibuprofen (ADVIL) 800 MG tablet Take 1 tablet (800 mg total) by mouth every 8 (eight) hours as needed. (Patient not taking: Reported on 05/24/2020) 90 tablet 0  . rizatriptan (MAXALT-MLT) 10 MG disintegrating tablet Take 1 tablet (10 mg total) by mouth as needed for migraine. May repeat in 2 hours if needed (Patient not taking: Reported on 05/24/2020) 10 tablet 0   No current facility-administered medications for this visit.     No past surgical history on file.   Allergies  Allergen Reactions  . Levofloxacin Nausea And Vomiting      Family History  Problem Relation Age of Onset  . Hypertension Mother   . Heart disease Mother   . Dementia Father   . Heart disease Father      Social History Mr. Rana reports that he has never  smoked. He has never used smokeless tobacco. Mr. Silvernail reports no history of alcohol use.   Review of Systems CONSTITUTIONAL: No weight loss, fever, chills, weakness or fatigue.  HEENT: Eyes: No visual loss, blurred vision, double vision or yellow sclerae.No hearing loss, sneezing, congestion, runny nose or sore throat.  SKIN: No rash or itching.  CARDIOVASCULAR: per hpi RESPIRATORY: No shortness of breath, cough or sputum.  GASTROINTESTINAL: No anorexia, nausea, vomiting or diarrhea. No abdominal pain or blood.  GENITOURINARY: No burning on urination, no polyuria NEUROLOGICAL: No headache, dizziness, syncope, paralysis, ataxia, numbness or tingling in the extremities. No change in bowel or bladder control.  MUSCULOSKELETAL: No muscle, back pain, joint pain or stiffness.  LYMPHATICS: No enlarged nodes. No history of splenectomy.  PSYCHIATRIC: No history of depression or anxiety.  ENDOCRINOLOGIC: No reports of sweating, cold or heat intolerance. No polyuria or polydipsia.  Marland Kitchen   Physical Examination Today's Vitals   07/06/20 1309  BP: 110/64  Pulse: (!) 56  SpO2: 99%  Weight: 145 lb 6.4 oz (66 kg)  Height: 5\' 10"  (1.778 m)   Body mass index is 20.86 kg/m.  Gen: resting comfortably, no acute distress HEENT: no scleral icterus, pupils equal round and reactive, no palptable cervical adenopathy,  CV: RRR, no mr/g, no jvd Resp: Clear to auscultation bilaterally GI: abdomen is soft,  non-tender, non-distended, normal bowel sounds, no hepatosplenomegaly MSK: extremities are warm, no edema.  Skin: warm, no rash Neuro:  no focal deficits Psych: appropriate affect   Assessment and Plan  1. Chest pain - atypical symptoms as described above - no significant recurrence - regular high levels of exertion at the gym 6 days a week without exertional symptoms - no further wokrup at this time, can f/u as needed      Antoine Poche, M.D.

## 2020-07-06 NOTE — Patient Instructions (Signed)
Your physician recommends that you schedule a follow-up appointment in: AS NEEDED   Your physician recommends that you continue on your current medications as directed. Please refer to the Current Medication list given to you today.  Thank you for choosing [facility]!!    

## 2020-09-20 ENCOUNTER — Ambulatory Visit (INDEPENDENT_AMBULATORY_CARE_PROVIDER_SITE_OTHER): Payer: BC Managed Care – PPO | Admitting: Nurse Practitioner

## 2020-09-20 ENCOUNTER — Other Ambulatory Visit: Payer: Self-pay

## 2020-09-20 ENCOUNTER — Encounter: Payer: Self-pay | Admitting: Nurse Practitioner

## 2020-09-20 VITALS — BP 105/68 | HR 66 | Temp 97.7°F | Ht 70.0 in | Wt 143.0 lb

## 2020-09-20 DIAGNOSIS — M79604 Pain in right leg: Secondary | ICD-10-CM | POA: Insufficient documentation

## 2020-09-20 MED ORDER — METHOCARBAMOL 500 MG PO TABS
500.0000 mg | ORAL_TABLET | Freq: Four times a day (QID) | ORAL | 0 refills | Status: DC
Start: 2020-09-20 — End: 2022-05-31

## 2020-09-20 NOTE — Patient Instructions (Signed)
Epidermoid Cyst  An epidermoid cyst, also called an epidermal cyst, is a small lump under your skin. The cyst contains a substance called keratin. Do not try to pop or open the cyst yourself. What are the causes?  A blocked hair follicle.  A hair that curls and re-enters the skin instead of growing straight out of the skin.  A blocked pore.  Irritated skin.  An injury to the skin.  Certain conditions that are passed along from parent to child.  Human papillomavirus (HPV). This happens rarely when cysts occur on the bottom of the feet.  Long-term sun damage to the skin. What increases the risk?  Having acne.  Being male.  Having an injury to the skin.  Being past puberty.  Having certain conditions caused by genes (genetic disorder) What are the signs or symptoms? These cysts are usually harmless, but they can get infected. Symptoms of infection may include:  Redness.  Inflammation.  Tenderness.  Warmth.  Fever.  A bad-smelling substance that drains from the cyst.  Pus that drains from the cyst. How is this treated? In many cases, epidermoid cysts go away on their own without treatment. If a cyst becomes infected, treatment may include:  Opening and draining the cyst, done by a doctor. After draining, you may need minor surgery to remove the rest of the cyst.  Antibiotic medicine.  Shots of medicines (steroids) that help to reduce inflammation.  Surgery to remove the cyst. Surgery may be done if the cyst: ? Becomes large. ? Bothers you. ? Has a chance of turning into cancer.  Do not try to open a cyst yourself. Follow these instructions at home: Medicines  Take over-the-counter and prescription medicines as told by your doctor.  If you were prescribed an antibiotic medicine, take it as told by your doctor. Do not stop taking it even if you start to feel better. General instructions  Keep the area around your cyst clean and dry.  Wear loose, dry  clothing.  Avoid touching your cyst.  Check your cyst every day for signs of infection. Check for: ? Redness, swelling, or pain. ? Fluid or blood. ? Warmth. ? Pus or a bad smell.  Keep all follow-up visits. How is this prevented?  Wear clean, dry, clothing.  Avoid wearing tight clothing.  Keep your skin clean and dry. Take showers or baths every day. Contact a doctor if:  Your cyst has symptoms of infection.  Your condition does not improve or gets worse.  You have a cyst that looks different from other cysts you have had.  You have a fever. Get help right away if:  Redness spreads from the cyst into the area close by. Summary  An epidermoid cyst is a small lump under your skin.  If a cyst becomes infected, treatment may include surgery to open and drain the cyst, or to remove it.  Take over-the-counter and prescription medicines only as told by your doctor.  Contact a doctor if your condition is not improving or is getting worse.  Keep all follow-up visits. This information is not intended to replace advice given to you by your health care provider. Make sure you discuss any questions you have with your health care provider. Document Revised: 07/22/2019 Document Reviewed: 07/22/2019 Elsevier Patient Education  2021 [employer]  

## 2020-09-20 NOTE — Progress Notes (Signed)
Acute Office Visit  Subjective:    Patient ID: Christopher Vance, male    DOB: [birth year], 60 y.o.   MRN: [id number]  Chief Complaint  Patient presents with  . Cyst    HPI Patient is in today for for cysts on left buttock.  Patient reports cyst has been present for a long time with no cause of concerns or pain, but recently patient reports slight pain and discomfort that radiates down left thigh and leg.  Patient reports pain is mild and intermittent.  Range of motion is not altered.  Patient wants to make sure that nothing else is going on.   Past Medical History:  Diagnosis Date  . Acne rosacea   . DDD (degenerative disc disease), cervical   . Hyperlipidemia   . Multiple sclerosis (HCC)   . Multiple sclerosis (HCC)   . RLS (restless legs syndrome)     Past Surgical History:  Procedure Laterality Date  . NO PAST SURGERIES      Family History  Problem Relation Age of Onset  . Hypertension Mother   . Heart disease Mother   . Dementia Father   . Heart disease Father     Social History   Socioeconomic History  . Marital status: Widowed    Spouse name: Not on file  . Number of children: Not on file  . Years of education: Not on file  . Highest education level: Not on file  Occupational History  . Not on file  Tobacco Use  . Smoking status: Never Smoker  . Smokeless tobacco: Never Used  Vaping Use  . Vaping Use: Never used  Substance and Sexual Activity  . Alcohol use: No  . Drug use: No  . Sexual activity: Not on file  Other Topics Concern  . Not on file  Social History Narrative   Pt lives with his daughter, one story home. Completed 12th grade.    Social Determinants of Health   Financial Resource Strain: Not on file  Food Insecurity: Not on file  Transportation Needs: Not on file  Physical Activity: Not on file  Stress: Not on file  Social Connections: Not on file  Intimate Partner Violence: Not on file    Outpatient Medications Prior to Visit   Medication Sig Dispense Refill  . doxycycline (VIBRA-TABS) 100 MG tablet Take 1 tablet (100 mg total) by mouth 2 (two) times daily. 1 po bid 20 tablet 0  . ibuprofen (ADVIL) 800 MG tablet Take 1 tablet (800 mg total) by mouth every 8 (eight) hours as needed. 90 tablet 0  . rizatriptan (MAXALT-MLT) 10 MG disintegrating tablet Take 1 tablet (10 mg total) by mouth as needed for migraine. May repeat in 2 hours if needed 10 tablet 0   No facility-administered medications prior to visit.    Allergies  Allergen Reactions  . Levofloxacin Nausea And Vomiting    Review of Systems  Constitutional: Negative.   HENT: Negative.   Respiratory: Negative.   Cardiovascular: Negative.   Gastrointestinal: Negative.   Genitourinary: Negative.   Skin: Negative for rash and wound.  Neurological: Negative.   All other systems reviewed and are negative.      Objective:    Physical Exam Vitals and nursing note reviewed.  Constitutional:      Appearance: Normal appearance.  HENT:     Head: Normocephalic.     Nose: Nose normal.  Eyes:     Conjunctiva/sclera: Conjunctivae normal.  Cardiovascular:  Rate and Rhythm: Normal rate and regular rhythm.     Pulses: Normal pulses.     Heart sounds: Normal heart sounds.  Pulmonary:     Effort: Pulmonary effort is normal.     Breath sounds: Normal breath sounds.  Abdominal:     General: Bowel sounds are normal.  Skin:    Findings: No rash.          Comments: Palpable nodule left butt cheek. (Buttocks)   Neurological:     Mental Status: He is alert and oriented to person, place, and time.     BP 105/68   Pulse 66   Temp 97.7 F (36.5 C) (Temporal)   Ht 5\' 10"  (1.778 m)   Wt 143 lb (64.9 kg)   SpO2 100%   BMI 20.52 kg/m  Wt Readings from Last 3 Encounters:  09/20/20 143 lb (64.9 kg)  07/06/20 145 lb 6.4 oz (66 kg)  05/24/20 140 lb (63.5 kg)    Health Maintenance Due  Topic Date Due  . COVID-19 Vaccine (1) Never done  . HIV  Screening  Never done  . Hepatitis C Screening  Never done  . TETANUS/TDAP  Never done    There are no preventive care reminders to display for this patient.   Lab Results  Component Value Date   TSH 1.290 12/23/2018   Lab Results  Component Value Date   WBC 6.8 05/24/2020   HGB 14.0 05/24/2020   HCT 40.0 05/24/2020   MCV 94.8 05/24/2020   PLT 207 05/24/2020   Lab Results  Component Value Date   NA 138 05/24/2020   K 4.6 05/24/2020   CO2 27 05/24/2020   GLUCOSE 96 05/24/2020   BUN 20 05/24/2020   CREATININE 1.13 05/24/2020   BILITOT 0.7 12/23/2018   ALKPHOS 36 (L) 12/23/2018   AST 22 12/23/2018   ALT 16 12/23/2018   PROT 6.7 12/23/2018   ALBUMIN 4.4 12/23/2018   CALCIUM 9.4 05/24/2020   ANIONGAP 7 05/24/2020   Lab Results  Component Value Date   CHOL 189 12/23/2018   Lab Results  Component Value Date   HDL 70 12/23/2018   Lab Results  Component Value Date   LDLCALC 111 (H) 12/23/2018   Lab Results  Component Value Date   TRIG 39 12/23/2018   Lab Results  Component Value Date   CHOLHDL 2.7 12/23/2018   No results found for: HGBA1C     Assessment & Plan:   Problem List Items Addressed This Visit      Other   Pain of right lower extremity - Primary    Patient is reporting new pain of right lower extremity starting from the left butt cheek [buttocks].  Pain radiates down the left eye to left leg.  Patient is attributing pain to not in his buttock.  On assessment hard palpable nodule found.  Patient reports this has been present for a long time but has never been an issue until recently. Encourage patient to keep an eye on nodule to see if it increases in size.  Continue ibuprofen 800 mg as prescribed.  Flexeril to help with pain in thighs and legs.  Follow-up with worsening or unresolved symptoms.  Rx sent to pharmacy.       Relevant Medications   methocarbamol (ROBAXIN) 500 MG tablet       Meds ordered this encounter  Medications  .  methocarbamol (ROBAXIN) 500 MG tablet    Sig: Take 1 tablet (500 mg  total) by mouth 4 (four) times daily.    Dispense:  30 tablet    Refill:  0    Order Specific Question:   Supervising Provider    Answer:   Raliegh Ip [[id number]]     Daryll Drown, NP

## 2020-09-20 NOTE — Assessment & Plan Note (Signed)
Patient is reporting new pain of right lower extremity starting from the left butt cheek [buttocks].  Pain radiates down the left eye to left leg.  Patient is attributing pain to not in his buttock.  On assessment hard palpable nodule found.  Patient reports this has been present for a long time but has never been an issue until recently. Encourage patient to keep an eye on nodule to see if it increases in size.  Continue ibuprofen 800 mg as prescribed.  Flexeril to help with pain in thighs and legs.  Follow-up with worsening or unresolved symptoms.  Rx sent to pharmacy.

## 2021-01-31 ENCOUNTER — Other Ambulatory Visit: Payer: Self-pay | Admitting: Family Medicine

## 2021-05-18 ENCOUNTER — Other Ambulatory Visit: Payer: Self-pay | Admitting: Nurse Practitioner

## 2021-06-20 ENCOUNTER — Encounter: Payer: Self-pay | Admitting: Nurse Practitioner

## 2021-06-20 ENCOUNTER — Ambulatory Visit (INDEPENDENT_AMBULATORY_CARE_PROVIDER_SITE_OTHER): Payer: BC Managed Care – PPO | Admitting: Nurse Practitioner

## 2021-06-20 VITALS — BP 90/53 | HR 58 | Temp 97.7°F | Ht 70.0 in | Wt 142.1 lb

## 2021-06-20 DIAGNOSIS — K5901 Slow transit constipation: Secondary | ICD-10-CM

## 2021-06-20 DIAGNOSIS — L709 Acne, unspecified: Secondary | ICD-10-CM

## 2021-06-20 DIAGNOSIS — G35A Relapsing-remitting multiple sclerosis: Secondary | ICD-10-CM

## 2021-06-20 DIAGNOSIS — M25512 Pain in left shoulder: Secondary | ICD-10-CM

## 2021-06-20 DIAGNOSIS — G35 Multiple sclerosis: Secondary | ICD-10-CM | POA: Diagnosis not present

## 2021-06-20 DIAGNOSIS — Z0001 Encounter for general adult medical examination with abnormal findings: Secondary | ICD-10-CM | POA: Diagnosis not present

## 2021-06-20 DIAGNOSIS — G43109 Migraine with aura, not intractable, without status migrainosus: Secondary | ICD-10-CM

## 2021-06-20 DIAGNOSIS — Z0000 Encounter for general adult medical examination without abnormal findings: Secondary | ICD-10-CM

## 2021-06-20 DIAGNOSIS — G8929 Other chronic pain: Secondary | ICD-10-CM

## 2021-06-20 MED ORDER — IBUPROFEN 800 MG PO TABS: 800.0000 mg | ORAL_TABLET | Freq: Three times a day (TID) | ORAL | 0 refills | Status: DC | PRN

## 2021-06-20 MED ORDER — DOXYCYCLINE HYCLATE 100 MG PO TABS: 100.0000 mg | ORAL_TABLET | Freq: Two times a day (BID) | ORAL | 0 refills | Status: DC

## 2021-06-20 NOTE — Progress Notes (Signed)
Subjective:    Patient ID: Christopher Vance, male    DOB: 1961-02-06, 61 y.o.   MRN: [id number]   Chief Complaint: Annual Exam    HPI:  Christopher Vance is a 61 y.o. who identifies as a male who was assigned male at birth.   Social history: Lives with: his parents recently moved in with him. Work history: [employer] in today for follow up of the following chronic medical issues:  1. Annual physical exam  2. Migraine with aura and without status migrainosus, not intractable Has about 1-2 x a month. He takes maxalt as needed and that seem to work well for him.  3. Relapsing remitting multiple sclerosis (HCC) Dx in 1998. He is doing well an dis on no meds.   4. Chronic left shoulder pain Had left shoulder pain for several years. He saw ortho 1 time and now his right shoulder is starting to hurt. He does not want to see anyone right now. He will let us know when he wants to see ortho.only has pain whenhe is workin out or raising his arms a lot.  5. Slow transit constipation No recent problems.  6. Adult acne- Takes doxycycline on as needed basis. Usually breaks out on the tip of his nose  New complaints: none  Allergies  Allergen Reactions   Levofloxacin Nausea And Vomiting   Outpatient Encounter Medications as of 06/20/2021  Medication Sig   ibuprofen (ADVIL) 800 MG tablet TAKE 1 TABLET EVERY 8 HOURS AS NEEDED   rizatriptan (MAXALT-MLT) 10 MG disintegrating tablet Take 1 tablet (10 mg total) by mouth as needed for migraine. May repeat in 2 hours if needed   methocarbamol (ROBAXIN) 500 MG tablet Take 1 tablet (500 mg total) by mouth 4 (four) times daily. (Patient not taking: Reported on 06/20/2021)   [DISCONTINUED] doxycycline (VIBRA-TABS) 100 MG tablet Take 1 tablet (100 mg total) by mouth 2 (two) times daily. 1 po bid (Patient not taking: Reported on 06/20/2021)   No facility-administered encounter medications on file as of 06/20/2021.    Past Surgical  History:  Procedure Laterality Date   NO PAST SURGERIES      Family History  Problem Relation Age of Onset   Hypertension Mother    Heart disease Mother    Dementia Father    Heart disease Father       Controlled substance contract: n/a     Review of Systems  Constitutional:  Negative for diaphoresis.  Eyes:  Negative for pain.  Respiratory:  Negative for shortness of breath.   Cardiovascular:  Negative for chest pain, palpitations and leg swelling.  Gastrointestinal:  Negative for abdominal pain.  Endocrine: Negative for polydipsia.  Musculoskeletal:  Positive for arthralgias (bil shoulder pain).  Skin:  Negative for rash.  Neurological:  Negative for dizziness, weakness and headaches.  Hematological:  Does not bruise/bleed easily.  All other systems reviewed and are negative.     Objective:   Physical Exam Vitals and nursing note reviewed.  Constitutional:      Appearance: Normal appearance. He is well-developed.  HENT:     Head: Normocephalic.     Nose: Nose normal.     Mouth/Throat:     Mouth: Mucous membranes are moist.     Pharynx: Oropharynx is clear.  Eyes:     Pupils: Pupils are equal, round, and reactive to light.  Neck:     Thyroid: No thyroid mass or thyromegaly.  Vascular: No carotid bruit or JVD.     Trachea: Phonation normal.  Cardiovascular:     Rate and Rhythm: Normal rate and regular rhythm.  Pulmonary:     Effort: Pulmonary effort is normal. No respiratory distress.     Breath sounds: Normal breath sounds.  Abdominal:     General: Bowel sounds are normal.     Palpations: Abdomen is soft.     Tenderness: There is no abdominal tenderness.  Musculoskeletal:        General: Normal range of motion.     Cervical back: Normal range of motion and neck supple.     Comments: FROM of bil shoulders with pain on abduction  Lymphadenopathy:     Cervical: No cervical adenopathy.  Skin:    General: Skin is warm and dry.  Neurological:      Mental Status: He is alert and oriented to person, place, and time.  Psychiatric:        Behavior: Behavior normal.        Thought Content: Thought content normal.        Judgment: Judgment normal.   BP (!) 90/53    Pulse (!) 58    Temp 97.7 F (36.5 C) (Temporal)    Ht 5\' 10"  (1.778 m)    Wt 142 lb 2 oz (64.5 kg)    BMI 20.39 kg/m         Assessment & Plan:   Christopher Vance comes in today with chief complaint of Annual Exam   Diagnosis and orders addressed:  1. Annual physical exam Labs will be drawn at [facility] and results sent to office  2. Migraine with aura and without status migrainosus, not intractable Avoid caffeine Contnue maxalt as needed  3. Relapsing remitting multiple sclerosis (HCC) Let [name] know if legs become weak or faling alit  4. Chronic left shoulder pain Will do referral to ortho when ready  5. Slow transit constipation Increase fiber in diet  6. Adult acne - doxycycline (VIBRA-TABS) 100 MG tablet; Take 1 tablet (100 mg total) by mouth 2 (two) times daily. 1 po bid  Dispense: 20 tablet; Refill: 0   Labs pending Health Maintenance reviewed Diet and exercise encouraged  Follow up plan: 1 year and prn   Mary-Margaret [place], FNP

## 2021-06-20 NOTE — Patient Instructions (Signed)
Exercising to Stay Healthy °To become healthy and stay healthy, it is recommended that you do moderate-intensity and vigorous-intensity exercise. You can tell that you are exercising at a moderate intensity if your heart starts beating faster and you start breathing faster but can still hold a conversation. You can tell that you are exercising at a vigorous intensity if you are breathing much harder and faster and cannot hold a conversation while exercising. °How can exercise benefit me? °Exercising regularly is important. It has many health benefits, such as: °Improving overall fitness, flexibility, and endurance. °Increasing bone density. °Helping with weight control. °Decreasing body fat. °Increasing muscle strength and endurance. °Reducing stress and tension, anxiety, depression, or anger. °Improving overall health. °What guidelines should I follow while exercising? °Before you start a new exercise program, talk with your health care provider. °Do not exercise so much that you hurt yourself, feel dizzy, or get very short of breath. °Wear comfortable clothes and wear shoes with good support. °Drink plenty of water while you exercise to prevent dehydration or heat stroke. °Work out until your breathing and your heartbeat get faster (moderate intensity). °How often should I exercise? °Choose an activity that you enjoy, and set realistic goals. Your health care provider can help you make an activity plan that is individually designed and works best for you. °Exercise regularly as told by your health care provider. This may include: °Doing strength training two times a week, such as: °Lifting weights. °Using resistance bands. °Push-ups. °Sit-ups. °Yoga. °Doing a certain intensity of exercise for a given amount of time. Choose from these options: °A total of 150 minutes of moderate-intensity exercise every week. °A total of 75 minutes of vigorous-intensity exercise every week. °A mix of moderate-intensity and  vigorous-intensity exercise every week. °Children, pregnant women, people who have not exercised regularly, people who are overweight, and older adults may need to talk with a health care provider about what activities are safe to perform. If you have a medical condition, be sure to talk with your health care provider before you start a new exercise program. °What are some exercise ideas? °Moderate-intensity exercise ideas include: °Walking 1 mile (1.6 km) in about 15 minutes. °Biking. °Hiking. °Golfing. °Dancing. °Water aerobics. °Vigorous-intensity exercise ideas include: °Walking 4.5 miles (7.2 km) or more in about 1 hour. °Jogging or running 5 miles (8 km) in about 1 hour. °Biking 10 miles (16.1 km) or more in about 1 hour. °Lap swimming. °Roller-skating or in-line skating. °Cross-country skiing. °Vigorous competitive sports, such as football, basketball, and soccer. °Jumping rope. °Aerobic dancing. °What are some everyday activities that can help me get exercise? °Yard work, such as: °Pushing a lawn mower. °Raking and bagging leaves. °Washing your car. °Pushing a stroller. °Shoveling snow. °Gardening. °Washing windows or floors. °How can I be more active in my day-to-day activities? °Use stairs instead of an elevator. °Take a walk during your lunch break. °If you drive, park your car farther away from your work or school. °If you take public transportation, get off one stop early and walk the rest of the way. °Stand up or walk around during all of your indoor phone calls. °Get up, stretch, and walk around every 30 minutes throughout the day. °Enjoy exercise with a friend. Support to continue exercising will help you keep a regular routine of activity. °Where to find more information °You can find more information about exercising to stay healthy from: °[place] Department of Health and Human Services: www.hhs.gov °Centers for Disease Control and Prevention (  CDC): www.cdc.gov °Summary °Exercising regularly is  important. It will improve your overall fitness, flexibility, and endurance. °Regular exercise will also improve your overall health. It can help you control your weight, reduce stress, and improve your bone density. °Do not exercise so much that you hurt yourself, feel dizzy, or get very short of breath. °Before you start a new exercise program, talk with your health care provider. °This information is not intended to replace advice given to you by your health care provider. Make sure you discuss any questions you have with your health care provider. °Document Revised: 08/12/2020 Document Reviewed: 08/12/2020 °Elsevier Patient Education © 2022 [employer] ° °

## 2021-06-27 DIAGNOSIS — Z125 Encounter for screening for malignant neoplasm of prostate: Secondary | ICD-10-CM | POA: Diagnosis not present

## 2021-06-27 DIAGNOSIS — Z0000 Encounter for general adult medical examination without abnormal findings: Secondary | ICD-10-CM | POA: Diagnosis not present

## 2021-07-06 NOTE — Telephone Encounter (Signed)
I have not seen results yet 

## 2021-07-06 NOTE — Telephone Encounter (Signed)
Have you seen MMM? ?

## 2021-07-11 ENCOUNTER — Other Ambulatory Visit: Payer: Self-pay | Admitting: Nurse Practitioner

## 2021-07-11 DIAGNOSIS — L709 Acne, unspecified: Secondary | ICD-10-CM

## 2021-07-21 NOTE — Telephone Encounter (Signed)
done

## 2021-07-21 NOTE — Telephone Encounter (Signed)
Please print results and scan into chart ?

## 2021-07-25 NOTE — Telephone Encounter (Signed)
Did not get labs- will you see if you can find results ?

## 2021-07-25 NOTE — Telephone Encounter (Signed)
Labs are in message from 03/09 in [place] message from patient  ?

## 2022-05-31 ENCOUNTER — Ambulatory Visit (INDEPENDENT_AMBULATORY_CARE_PROVIDER_SITE_OTHER): Payer: BC Managed Care – PPO | Admitting: Nurse Practitioner

## 2022-05-31 ENCOUNTER — Encounter: Payer: Self-pay | Admitting: Nurse Practitioner

## 2022-05-31 VITALS — BP 104/67 | HR 70 | Temp 97.7°F | Resp 20 | Ht 70.0 in | Wt 145.0 lb

## 2022-05-31 DIAGNOSIS — M2550 Pain in unspecified joint: Secondary | ICD-10-CM | POA: Diagnosis not present

## 2022-05-31 DIAGNOSIS — L709 Acne, unspecified: Secondary | ICD-10-CM

## 2022-05-31 MED ORDER — DOXYCYCLINE HYCLATE 100 MG PO TABS: 100.0000 mg | ORAL_TABLET | Freq: Two times a day (BID) | ORAL | 0 refills | Status: AC

## 2022-05-31 MED ORDER — CELECOXIB 200 MG PO CAPS: 200.0000 mg | ORAL_CAPSULE | Freq: Two times a day (BID) | ORAL | 3 refills | Status: DC

## 2022-05-31 NOTE — Addendum Note (Signed)
Addended by: Chevis Pretty on: 05/31/2022 04:00 PM   Modules accepted: Orders

## 2022-05-31 NOTE — Patient Instructions (Signed)
Celecoxib Capsules What is this medication? CELECOXIB (sell a KOX ib) treats mild to moderate pain, inflammation, or arthritis. It belongs to a group of medications called NSAIDs. This medicine may be used for other purposes; ask your health care provider or pharmacist if you have questions. COMMON BRAND NAME(S): Celebrex What should I tell my care team before I take this medication? They need to know if you have any of these conditions: Bleeding disorders Coronary artery bypass graft (CABG) within the past 2 weeks Heart attack Heart disease Heart failure High blood pressure High levels of potassium in the blood If you often drink alcohol Kidney disease Liver disease Low red blood cell counts Lung or breathing disease (asthma) Receiving steroids like dexamethasone or prednisone Smoke cigarettes Stomach bleeding Stomach or intestine problems Take medications that treat or prevent blood clots An unusual or allergic reaction to celecoxib, other medications, foods, dyes, or preservatives Pregnant or trying to get pregnant Breast-feeding How should I use this medication? Take this medication by mouth with water. Take it as directed on the prescription label at the same time every day. Do not cut, crush or chew this medication. Swallow the capsules whole. You may open the capsule and put the contents in 1 teaspoon of applesauce. Swallow the medication and applesauce right away. Do not chew the medication or applesauce. Keep taking it unless your care team tells you to stop. A special MedGuide will be given to you by the pharmacist with each prescription and refill. Be sure to read this information carefully each time. Talk to your care team regarding the use of this medication in children. While this medication may be prescribed for children as young as 37 years old for selected conditions, precautions do apply. Patients over 81 years of age may have a stronger reaction and need a smaller  dose. Overdosage: If you think you have taken too much of this medicine contact a poison control center or emergency room at once. NOTE: This medicine is only for you. Do not share this medicine with others. What if I miss a dose? If you miss a dose, take it as soon as you can. If it is almost time for your next dose, take only that dose. Do not take double or extra doses. What may interact with this medication? Do not take this medication with any of the following: Cidofovir Ketorolac Thioridazine This medication may also interact with the following: Alcohol Aspirin and aspirin-like medications Atomoxetine Certain medications for blood pressure, heart disease, irregular heart beat Certain medications for depression, anxiety, or psychotic disorders Certain medications that treat or prevent blood clots like warfarin, enoxaparin, dalteparin, apixaban, dabigatran, and rivaroxaban Cyclosporine Digoxin Diuretics Fluconazole Lithium Methotrexate Other NSAIDs, medications for pain and inflammation, like ibuprofen or naproxen Pemetrexed Rifampin Steroid medications like prednisone or cortisone This list may not describe all possible interactions. Give your health care provider a list of all the medicines, herbs, non-prescription drugs, or dietary supplements you use. Also tell them if you smoke, drink alcohol, or use illegal drugs. Some items may interact with your medicine. What should I watch for while using this medication? Visit your care team for regular checks on your progress. Tell your care team if your symptoms do not start to get better or if they get worse. Do not take other medications that contain aspirin, ibuprofen, or naproxen with this medication. Side effects such as stomach upset, nausea, or ulcers may be more likely to occur. Many non-prescription medications contain aspirin, ibuprofen,  or naproxen. Always read labels carefully. This medication can cause serious ulcers and  bleeding in the stomach. It can happen with no warning. Smoking, drinking alcohol, older age, and poor health can also increase risks. Call your care team right away if you have stomach pain or blood in your vomit or stool. This medication does not prevent a heart attack or stroke. This medication may increase the chance of a heart attack or stroke. The chance may increase the longer you use this medication or if you have heart disease. If you take aspirin to prevent a heart attack or stroke, talk to your care team about using this medication. Alcohol may interfere with the effect of this medication. Avoid alcoholic drinks. This medication may cause serious skin reactions. They can happen weeks to months after starting the medication. Contact your care team right away if you notice fevers or flu-like symptoms with a rash. The rash may be red or purple and then turn into blisters or peeling of the skin. Or, you might notice a red rash with swelling of the face, lips or lymph nodes in your neck or under your arms. Talk to your care team if you are pregnant before taking this medication. Taking this medication between weeks 20 and 30 of pregnancy may harm your unborn baby. Your care team will monitor you closely if you need to take it. After 30 weeks of pregnancy, do not take this medication. You may get drowsy or dizzy. Do not drive, use machinery, or do anything that needs mental alertness until you know how this medication affects you. Do not stand up or sit up quickly, especially if you are an older patient. This reduces the risk of dizzy or fainting spells. Be careful brushing or flossing your teeth or using a toothpick because you may get an infection or bleed more easily. If you have any dental work done, tell your dentist you are receiving this medication. This medication may make it more difficult to get pregnant. Talk to your care team if you are concerned about your fertility. What side effects may I  notice from receiving this medication? Side effects that you should report to your care team as soon as possible: Allergic reactions--skin rash, itching, hives, swelling of the face, lips, tongue, or throat Bleeding--bloody or black, tar-like stools, vomiting blood or brown material that looks like coffee grounds, red or dark brown urine, small red or purple spots on skin, unusual bruising or bleeding Heart attack--pain or tightness in the chest, shoulders, arms, or jaw, nausea, shortness of breath, cold or clammy skin, feeling faint or lightheaded Heart failure--shortness of breath, swelling of ankles, feet, or hands, sudden weight gain, unusual weakness or fatigue Increase in blood pressure Kidney injury--decrease in the amount of urine, swelling of the ankles, hands, or feet Liver injury--right upper belly pain, loss of appetite, nausea, light-colored stool, dark yellow or brown urine, yellowing skin or eyes, unusual weakness or fatigue Rash, fever, and swollen lymph nodes Redness, blistering, peeling, or loosening of the skin, including inside the mouth Stroke--sudden numbness or weakness of the face, arm, or leg, trouble speaking, confusion, trouble walking, loss of balance or coordination, dizziness, severe headache, change in vision Side effects that usually do not require medical attention (report to your care team if they continue or are bothersome): Headache Loss of appetite Nausea Upset stomach This list may not describe all possible side effects. Call your doctor for medical advice about side effects. You may report side  effects to FDA at 1-800-FDA-1088. Where should I keep my medication? Keep out of the reach of children and pets. Store at room temperature between 15 and 30 degrees C (59 and 86 degrees F). Get rid of any unused medication after the expiration date. To get rid of medications that are no longer needed or have expired: Take the medication to a medication take-back  program. Check with your pharmacy or law enforcement to find a location. If you cannot return the medication, check the label or package insert to see if the medication should be thrown out in the garbage or flushed down the toilet. If you are not sure, ask your care team. If it is safe to put it in the trash, empty the medication out of the container. Mix the medication with cat litter, dirt, coffee grounds, or other unwanted substance. Seal the mixture in a bag or container. Put it in the trash. NOTE: This sheet is a summary. It may not cover all possible information. If you have questions about this medicine, talk to your doctor, pharmacist, or health care provider.  2023 Elsevier/Gold Standard (2020-04-21 00:00:00)

## 2022-05-31 NOTE — Progress Notes (Signed)
Subjective:    Patient ID: Christopher Vance, male    DOB: December 12, 1960, 62 y.o.   MRN: [id number]   Chief Complaint: Discuss pain all over   HPI Patient comes in  today c/o all over joint achiness for several years. The last 6 months has gotten worse. He says his hands feel really tight and hurt all the time. He has achiness all over and varies in intensity from day to day he has been taking motrin as needed which helps some. The pain has started keeping him awake at night.   Family history of psoriatic arthritis  Review of Systems  Constitutional:  Negative for diaphoresis.  Eyes:  Negative for pain.  Respiratory:  Negative for shortness of breath.   Cardiovascular:  Negative for chest pain, palpitations and leg swelling.  Gastrointestinal:  Negative for abdominal pain.  Endocrine: Negative for polydipsia.  Skin:  Negative for rash.  Neurological:  Negative for dizziness, weakness and headaches.  Hematological:  Does not bruise/bleed easily.  All other systems reviewed and are negative.      Objective:   Physical Exam Vitals and nursing note reviewed.  Constitutional:      Appearance: Normal appearance. He is well-developed.  Neck:     Thyroid: No thyroid mass or thyromegaly.     Vascular: No carotid bruit or JVD.     Trachea: Phonation normal.  Cardiovascular:     Rate and Rhythm: Normal rate and regular rhythm.  Pulmonary:     Effort: Pulmonary effort is normal. No respiratory distress.     Breath sounds: Normal breath sounds.  Abdominal:     General: Bowel sounds are normal.     Palpations: Abdomen is soft.     Tenderness: There is no abdominal tenderness.  Musculoskeletal:        General: Normal range of motion.     Cervical back: Normal range of motion and neck supple.     Comments: No joint swelling Grips equal bil Gait steady  Lymphadenopathy:     Cervical: No cervical adenopathy.  Skin:    General: Skin is warm and dry.  Neurological:     Mental Status:  He is alert and oriented to person, place, and time.  Psychiatric:        Behavior: Behavior normal.        Thought Content: Thought content normal.        Judgment: Judgment normal.     BP 104/67   Pulse 70   Temp 97.7 F (36.5 C) (Temporal)   Resp 20   Ht 5\' 10"  (1.778 m)   Wt 145 lb (65.8 kg)   SpO2 100%   BMI 20.81 kg/m        Assessment & Plan:   [name] in today with chief complaint of Discuss pain all over   1. Arthralgia, unspecified joint Labs pending Moist heat Rest  RTO prn - celecoxib (CELEBREX) 200 MG capsule; Take 1 capsule (200 mg total) by mouth 2 (two) times daily.  Dispense: 60 capsule; Refill: 3 - Arthritis Panel - CMP14+EGFR - Thyroid Panel With TSH - VITAMIN D 25 Hydroxy (Vit-D Deficiency, Fractures) - Vitamin B12    The above assessment and management plan was discussed with the patient. The patient verbalized understanding of and has agreed to the management plan. Patient is aware to call the clinic if symptoms persist or worsen. Patient is aware when to return to the clinic for a follow-up visit. Patient  educated on when it is appropriate to go to the emergency department.   Mary-Margaret Hassell Done, FNP

## 2022-06-01 LAB — ARTHRITIS PANEL
Basophils Absolute: 0 10*3/uL (ref 0.0–0.2)
Basos: 1 %
EOS (ABSOLUTE): 0.1 10*3/uL (ref 0.0–0.4)
Eos: 2 %
Hematocrit: 40.6 % (ref 37.5–51.0)
Hemoglobin: 14 g/dL (ref 13.0–17.7)
Immature Grans (Abs): 0 10*3/uL (ref 0.0–0.1)
Immature Granulocytes: 0 %
Lymphocytes Absolute: 1.3 10*3/uL (ref 0.7–3.1)
Lymphs: 28 %
MCH: 31.7 pg (ref 26.6–33.0)
MCHC: 34.5 g/dL (ref 31.5–35.7)
MCV: 92 fL (ref 79–97)
Monocytes Absolute: 0.7 10*3/uL (ref 0.1–0.9)
Monocytes: 14 %
Neutrophils Absolute: 2.6 10*3/uL (ref 1.4–7.0)
Neutrophils: 55 %
Platelets: 228 10*3/uL (ref 150–450)
RBC: 4.41 x10E6/uL (ref 4.14–5.80)
RDW: 12.3 % (ref 11.6–15.4)
Rheumatoid fact SerPl-aCnc: <10 IU/mL (ref <14.0)
Sed Rate: 3 mm/hr (ref 0–30)
Uric Acid: 4.7 mg/dL (ref 3.8–8.4)
WBC: 4.7 10*3/uL (ref 3.4–10.8)

## 2022-06-01 LAB — CMP14+EGFR
ALT: 15 IU/L (ref 0–44)
AST: 21 IU/L (ref 0–40)
Albumin/Globulin Ratio: 1.7 (ref 1.2–2.2)
Albumin: 4.4 g/dL (ref 3.8–4.9)
Alkaline Phosphatase: 40 IU/L — ABNORMAL LOW (ref 44–121)
BUN/Creatinine Ratio: 14 (ref 10–24)
BUN: 14 mg/dL (ref 8–27)
Bilirubin Total: 0.4 mg/dL (ref 0.0–1.2)
CO2: 24 mmol/L (ref 20–29)
Calcium: 9.2 mg/dL (ref 8.6–10.2)
Chloride: 102 mmol/L (ref 96–106)
Creatinine, Ser: 0.99 mg/dL (ref 0.76–1.27)
Globulin, Total: 2.6 g/dL (ref 1.5–4.5)
Glucose: 80 mg/dL (ref 70–99)
Potassium: 4.6 mmol/L (ref 3.5–5.2)
Sodium: 140 mmol/L (ref 134–144)
Total Protein: 7 g/dL (ref 6.0–8.5)
eGFR: 87 mL/min/{1.73_m2} (ref >59)

## 2022-06-01 LAB — THYROID PANEL WITH TSH
Free Thyroxine Index: 1.8 (ref 1.2–4.9)
T3 Uptake Ratio: 27 % (ref 24–39)
T4, Total: 6.5 ug/dL (ref 4.5–12.0)
TSH: 1.45 u[IU]/mL (ref 0.450–4.500)

## 2022-06-01 LAB — VITAMIN D 25 HYDROXY (VIT D DEFICIENCY, FRACTURES): Vit D, 25-Hydroxy: 38.3 ng/mL (ref 30.0–100.0)

## 2022-06-01 LAB — VITAMIN B12: Vitamin B-12: 762 pg/mL (ref 232–1245)

## 2022-06-01 NOTE — Addendum Note (Signed)
Addended by: Chevis Pretty on: 06/01/2022 07:57 AM   Modules accepted: Orders

## 2022-08-22 ENCOUNTER — Ambulatory Visit (INDEPENDENT_AMBULATORY_CARE_PROVIDER_SITE_OTHER): Payer: BC Managed Care – PPO

## 2022-08-22 ENCOUNTER — Encounter: Payer: Self-pay | Admitting: Internal Medicine

## 2022-08-22 ENCOUNTER — Ambulatory Visit: Payer: BC Managed Care – PPO | Attending: Internal Medicine | Admitting: Internal Medicine

## 2022-08-22 ENCOUNTER — Ambulatory Visit: Payer: BC Managed Care – PPO

## 2022-08-22 VITALS — BP 103/66 | HR 56 | Resp 12 | Ht 69.25 in | Wt 142.0 lb

## 2022-08-22 DIAGNOSIS — M79642 Pain in left hand: Secondary | ICD-10-CM

## 2022-08-22 DIAGNOSIS — M79641 Pain in right hand: Secondary | ICD-10-CM

## 2022-08-22 DIAGNOSIS — M19042 Primary osteoarthritis, left hand: Secondary | ICD-10-CM | POA: Diagnosis not present

## 2022-08-22 DIAGNOSIS — M19041 Primary osteoarthritis, right hand: Secondary | ICD-10-CM | POA: Diagnosis not present

## 2022-08-22 DIAGNOSIS — R21 Rash and other nonspecific skin eruption: Secondary | ICD-10-CM | POA: Diagnosis not present

## 2022-08-22 NOTE — Patient Instructions (Signed)

## 2022-08-22 NOTE — Progress Notes (Signed)
Office Visit Note  Patient: Christopher Vance             Date of Birth: 1960-06-08           MRN: [id number]             PCP: Bennie Pierini, FNP Referring: Bennie Pierini, * Visit Date: 08/22/2022 Occupation: Bank  Subjective:  New Patient (Initial Visit) (Patient states everything has happen in six months and it was a quick onset of symptoms. Patient states he has pain in his hands, back, and knees. Patient states he gets a rash across his head.)   History of Present Illness: Christopher Vance is a 62 y.o. male here for evaluation of joint pain in multiple areas. Autoimmune arthritis panel was unremarkable.  Had fairly abrupt onset of new symptoms with pain especially affecting his hands and knees and in the back.  This is associated with morning stiffness that improves later into the day.  Currently symptoms are doing better compared to when he was referred though did not notice any specific change in activities with this.  Also did not recall any specific infection injury or medical event prior to onset.  Did notice benefit with ibuprofen did not see much relief when prescribed Celebrex.  Is also been noticing rash on the scalp and some increased thinning or breaking of fingernails on both hands.  The scalp rash has improved with over-the-counter medicated shampoo treatment.   Activities of Daily Living:  Patient reports morning stiffness for 30-60 minutes.   Patient Reports nocturnal pain.  Difficulty dressing/grooming: Denies Difficulty climbing stairs: Reports Difficulty getting out of chair: Reports Difficulty using hands for taps, buttons, cutlery, and/or writing: Reports  Review of Systems  Constitutional:  Positive for fatigue.  HENT:  Negative for mouth sores and mouth dryness.   Eyes:  Positive for dryness.  Respiratory:  Negative for shortness of breath.   Cardiovascular:  Negative for chest pain and palpitations.  Gastrointestinal:  Negative for blood in  stool, constipation and diarrhea.  Endocrine: Negative for increased urination.  Genitourinary:  Negative for involuntary urination.  Musculoskeletal:  Positive for joint pain, gait problem, joint pain, myalgias, morning stiffness, muscle tenderness and myalgias. Negative for joint swelling and muscle weakness.  Skin:  Negative for color change, rash, hair loss and sensitivity to sunlight.  Allergic/Immunologic: Negative for susceptible to infections.  Neurological:  Negative for dizziness and headaches.  Hematological:  Negative for swollen glands.  Psychiatric/Behavioral:  Negative for depressed mood and sleep disturbance. The patient is not nervous/anxious.     PMFS History:  Patient Active Problem List   Diagnosis Date Noted   Osteoarthritis of hands, bilateral 08/22/2022   Rash and other nonspecific skin eruption 08/22/2022   Adult acne 06/20/2021   Pain of right lower extremity 09/20/2020   Constipation 12/03/2019   Chronic left shoulder pain 12/25/2018   White matter abnormality on MRI of brain 06/03/2015   Relapsing remitting multiple sclerosis (HCC) 02/21/2015   Migraine with aura and without status migrainosus, not intractable 02/21/2015    Past Medical History:  Diagnosis Date   Acne rosacea    DDD (degenerative disc disease), cervical    Hyperlipidemia    Multiple sclerosis (HCC)    Multiple sclerosis (HCC)    RLS (restless legs syndrome)     Family History  Problem Relation Age of Onset   Hypertension Mother    Heart disease Mother    Dementia Father    Heart  disease Father    Past Surgical History:  Procedure Laterality Date   NO PAST SURGERIES     Social History   Social History Narrative   Pt lives with his daughter, one story home. Completed 12th grade.     There is no immunization history on file for this patient.   Objective: Vital Signs: BP 103/66 (BP Location: Right Arm, Patient Position: Sitting, Cuff Size: Normal)   Pulse (!) 56   Resp 12    Ht 5' 9.25" (1.759 m)   Wt 142 lb (64.4 kg)   BMI 20.82 kg/m    Physical Exam Eyes:     Conjunctiva/sclera: Conjunctivae normal.  Cardiovascular:     Rate and Rhythm: Normal rate and regular rhythm.  Pulmonary:     Effort: Pulmonary effort is normal.     Breath sounds: Normal breath sounds.  Musculoskeletal:     Right lower leg: No edema.     Left lower leg: No edema.  Lymphadenopathy:     Cervical: No cervical adenopathy.  Skin:    General: Skin is warm and dry.  Neurological:     Mental Status: He is alert.  Psychiatric:        Mood and Affect: Mood normal.      Musculoskeletal Exam:  Shoulders full ROM no tenderness or swelling Elbows full ROM no tenderness or swelling Wrists full ROM no tenderness or swelling Fingers full ROM no tenderness or swelling No paraspinal tenderness to palpation over upper and lower back Hip normal internal and external rotation without pain, no tenderness to lateral hip palpation Knees full ROM no tenderness or swelling Investigation: No additional findings.  Imaging: XR Hand 2 View Left  Result Date: 08/22/2022 X-ray left hand 2 views Radiocarpal joint space appears normal.  Moderate first CMC joint degenerative changes and subluxation more advanced as compared to right hand.  MCP joint spaces preserved very small lateral osteophyte at third digit.  PIP and DIP joint spaces are normal tiny lateral osteophyte or calcification at second and third DIPs.  No erosions seen.  Bone mineralization appears normal. Impression Moderately severe first CMC joint osteoarthritis otherwise mild degenerative changes at MCP and DIP joints  XR Hand 2 View Right  Result Date: 08/22/2022 X-ray right hand 2 views Radiocarpal joint space appears normal.  Few cystic changes in the carpal bones moderate bone spurring and subluxation at first [facility] joint.  Degenerative appearing changes at second and third MCP with small lateral osteophyte and third digit joint  space narrowing.  Fifth metacarpal head abnormal consistent with previous partially displaced fracture.  PIP and DIP joint spaces appear well-preserved there are small osteophyte or particular calcifications visible at second DIP and fourth PIP.  No erosions seen.  Bone mineralization appears normal. Impression Osteoarthritis moderate in the first [facility] joint with mild MCP and DIP joint involvement   Recent Labs: Lab Results  Component Value Date   WBC 4.7 05/31/2022   HGB 14.0 05/31/2022   PLT 228 05/31/2022   NA 140 05/31/2022   K 4.6 05/31/2022   CL 102 05/31/2022   CO2 24 05/31/2022   GLUCOSE 80 05/31/2022   BUN 14 05/31/2022   CREATININE 0.99 05/31/2022   BILITOT 0.4 05/31/2022   ALKPHOS 40 (L) 05/31/2022   AST 21 05/31/2022   ALT 15 05/31/2022   PROT 7.0 05/31/2022   ALBUMIN 4.4 05/31/2022   CALCIUM 9.2 05/31/2022   GFRAA 110 12/23/2018    Speciality Comments: No specialty comments  available.  Procedures:  No procedures performed Allergies: Levofloxacin   Assessment / Plan:     Visit Diagnoses: Primary osteoarthritis of both hands Bilateral hand pain - Plan: XR Hand 2 View Right, XR Hand 2 View Left  New relatively abrupt onset bilateral hand pains although no peripheral synovitis appreciable on exam today and his symptoms are doing overall better compared to at onset.  X-ray of bilateral hands checked in clinic shows osteoarthritis most severe at the first [facility] joint of both hands which is consistent with his area of pain.  Currently not severe enough or limiting activity enough to need procedural intervention.  Okay with NSAIDs also provided additional handout information on supplement supportive treatments for osteoarthritis.  We can follow-up as needed if the symptoms worsen or start seeing more concerning signs for inflammation.  Rash and other nonspecific skin eruption  Reported rash questionable for inflammation possible psoriasis though do not see definite plaques on  exam today. Nail changes appear nonspecific. Also okay to continue OTC scalp treatment.  Orders: Orders Placed This Encounter  Procedures   XR Hand 2 View Right   XR Hand 2 View Left   No orders of the defined types were placed in this encounter.    Follow-Up Instructions: Return if symptoms worsen or fail to improve.   Fuller Plan, MD  Note - This record has been created using [employer].  Chart creation errors have been sought, but may not always  have been located. Such creation errors do not reflect on  the standard of medical care.

## 2022-10-01 ENCOUNTER — Encounter: Payer: Self-pay | Admitting: Internal Medicine

## 2022-12-23 ENCOUNTER — Other Ambulatory Visit: Payer: Self-pay | Admitting: Nurse Practitioner

## 2023-01-04 ENCOUNTER — Ambulatory Visit (INDEPENDENT_AMBULATORY_CARE_PROVIDER_SITE_OTHER): Payer: BC Managed Care – PPO | Admitting: Family Medicine

## 2023-01-04 ENCOUNTER — Encounter: Payer: Self-pay | Admitting: Family Medicine

## 2023-01-04 VITALS — BP 129/82 | HR 75 | Temp 97.5°F | Ht 69.25 in | Wt 137.6 lb

## 2023-01-04 DIAGNOSIS — L989 Disorder of the skin and subcutaneous tissue, unspecified: Secondary | ICD-10-CM

## 2023-01-04 DIAGNOSIS — M2550 Pain in unspecified joint: Secondary | ICD-10-CM

## 2023-01-04 MED ORDER — IBUPROFEN 800 MG PO TABS
800.0000 mg | ORAL_TABLET | Freq: Two times a day (BID) | ORAL | 0 refills | Status: DC | PRN
Start: 2023-01-04 — End: 2023-03-21

## 2023-01-04 MED ORDER — TRIAMCINOLONE ACETONIDE 0.025 % EX OINT
1.0000 | TOPICAL_OINTMENT | Freq: Two times a day (BID) | CUTANEOUS | 0 refills | Status: AC
Start: 2023-01-04 — End: ?

## 2023-01-04 MED ORDER — IBUPROFEN 800 MG PO TABS
800.0000 mg | ORAL_TABLET | Freq: Three times a day (TID) | ORAL | 0 refills | Status: DC | PRN
Start: 2023-01-04 — End: 2023-01-04

## 2023-01-04 NOTE — Progress Notes (Signed)
Subjective:  Patient ID: Christopher Vance, male    DOB: April 08, 1961, 62 y.o.   MRN: [id number]  Patient Care Team: Bennie Pierini, FNP as PCP - General (Family Medicine) Wyline Mood, Dorothe Pea, MD as PCP - Cardiology (Cardiology)   Chief Complaint:  Referral (Derm - rash x 8 months )  HPI: Christopher Vance is a 62 y.o. male presenting on 01/04/2023 for Referral (Derm - rash x 8 months ) States that it started at the beginning of the year and he has some arthritis symptoms with it. He was seen by PCP and prescribed NSAIDs for the joint pain and referred to rheumatology. Labs and rheum evaluation normal. States that ibuprofen works better for his joint pain and he has not been taking celebrex. Has tried Cerave on his head and nose, but it does not seem to be improving. Denies Fever. States that it is "pitting". Would like a referral to dermatology.   Relevant past medical, surgical, family, and social history reviewed and updated as indicated.  Allergies and medications reviewed and updated. Data reviewed: Chart in Epic.   Past Medical History:  Diagnosis Date   Acne rosacea    DDD (degenerative disc disease), cervical    Hyperlipidemia    Multiple sclerosis (HCC)    Multiple sclerosis (HCC)    RLS (restless legs syndrome)     Past Surgical History:  Procedure Laterality Date   NO PAST SURGERIES      Social History   Socioeconomic History   Marital status: Widowed    Spouse name: Not on file   Number of children: Not on file   Years of education: Not on file   Highest education level: Not on file  Occupational History   Not on file  Tobacco Use   Smoking status: Never    Passive exposure: Never   Smokeless tobacco: Never  Vaping Use   Vaping status: Never Used  Substance and Sexual Activity   Alcohol use: No   Drug use: No   Sexual activity: Not on file  Other Topics Concern   Not on file  Social History Narrative   Pt lives with his daughter, one story home.  Completed 12th grade.    Social Determinants of Health   Financial Resource Strain: Not on file  Food Insecurity: Not on file  Transportation Needs: Not on file  Physical Activity: Not on file  Stress: Not on file  Social Connections: Not on file  Intimate Partner Violence: Not on file    Outpatient Encounter Medications as of 01/04/2023  Medication Sig   ibuprofen (ADVIL) 800 MG tablet TAKE 1 TABLET EVERY 8 HOURS AS NEEDED   rizatriptan (MAXALT-MLT) 10 MG disintegrating tablet Take 1 tablet (10 mg total) by mouth as needed for migraine. May repeat in 2 hours if needed   celecoxib (CELEBREX) 200 MG capsule Take 1 capsule (200 mg total) by mouth 2 (two) times daily. (Patient not taking: Reported on 01/04/2023)   doxycycline (VIBRA-TABS) 100 MG tablet Take 1 tablet (100 mg total) by mouth 2 (two) times daily. 1 po bid (Patient not taking: Reported on 01/04/2023)   No facility-administered encounter medications on file as of 01/04/2023.    Allergies  Allergen Reactions   Levofloxacin Nausea And Vomiting    Review of Systems As per HPI   Objective:  BP 129/82   Pulse 75   Temp (!) 97.5 F (36.4 C) (Temporal)   Ht 5' 9.25" (1.759 m)  Wt 137 lb 9.6 oz (62.4 kg)   SpO2 100%   BMI 20.17 kg/m    Wt Readings from Last 3 Encounters:  01/04/23 137 lb 9.6 oz (62.4 kg)  08/22/22 142 lb (64.4 kg)  05/31/22 145 lb (65.8 kg)    Physical Exam Constitutional:      General: He is awake. He is not in acute distress.    Appearance: Normal appearance. He is well-developed and well-groomed. He is not ill-appearing, toxic-appearing or diaphoretic.  Cardiovascular:     Rate and Rhythm: Normal rate and regular rhythm.     Pulses: Normal pulses.          Radial pulses are 2+ on the right side and 2+ on the left side.       Posterior tibial pulses are 2+ on the right side and 2+ on the left side.     Heart sounds: Normal heart sounds. No murmur heard.    No gallop.  Pulmonary:     Effort:  Pulmonary effort is normal. No respiratory distress.     Breath sounds: Normal breath sounds. No stridor. No wheezing, rhonchi or rales.  Musculoskeletal:     Cervical back: Full passive range of motion without pain and neck supple.     Right lower leg: No edema.     Left lower leg: No edema.  Skin:    General: Skin is warm.     Capillary Refill: Capillary refill takes less than 2 seconds.     Comments: Cuticles with dry, flaky, erythematous skin and swelling.  Approximately 1.5 cm lesion on ventral aspect of head with erythema and central pitting with flaky, dry skin  Neurological:     General: No focal deficit present.     Mental Status: He is alert, oriented to person, place, and time and easily aroused. Mental status is at baseline.     GCS: GCS eye subscore is 4. GCS verbal subscore is 5. GCS motor subscore is 6.     Motor: No weakness.  Psychiatric:        Attention and Perception: Attention and perception normal.        Mood and Affect: Mood and affect normal.        Speech: Speech normal.        Behavior: Behavior normal. Behavior is cooperative.        Thought Content: Thought content normal. Thought content does not include homicidal or suicidal ideation. Thought content does not include homicidal or suicidal plan.        Cognition and Memory: Cognition and memory normal.        Judgment: Judgment normal.     Results for orders placed or performed in visit on 05/31/22  Arthritis Panel  Result Value Ref Range   Uric Acid 4.7 3.8 - 8.4 mg/dL   Rheumatoid fact SerPl-aCnc <10.0 <14.0 IU/mL   WBC 4.7 3.4 - 10.8 x10E3/uL   RBC 4.41 4.14 - 5.80 x10E6/uL   Hemoglobin 14.0 13.0 - 17.7 g/dL   Hematocrit 16.1 09.6 - 51.0 %   MCV 92 79 - 97 fL   MCH 31.7 26.6 - 33.0 pg   MCHC 34.5 31.5 - 35.7 g/dL   RDW 04.5 40.9 - 81.1 %   Platelets 228 150 - 450 x10E3/uL   Neutrophils 55 Not Estab. %   Lymphs 28 Not Estab. %   Monocytes 14 Not Estab. %   Eos 2 Not Estab. %   Basos 1 Not  Estab. %   Neutrophils Absolute 2.6 1.4 - 7.0 x10E3/uL   Lymphocytes Absolute 1.3 0.7 - 3.1 x10E3/uL   Monocytes Absolute 0.7 0.1 - 0.9 x10E3/uL   EOS (ABSOLUTE) 0.1 0.0 - 0.4 x10E3/uL   Basophils Absolute 0.0 0.0 - 0.2 x10E3/uL   Immature Granulocytes 0 Not Estab. %   Immature Grans (Abs) 0.0 0.0 - 0.1 x10E3/uL   Sed Rate 3 0 - 30 mm/hr  CMP14+EGFR  Result Value Ref Range   Glucose 80 70 - 99 mg/dL   BUN 14 8 - 27 mg/dL   Creatinine, Ser 1.61 0.76 - 1.27 mg/dL   eGFR 87 >09 UE/AVW/0.98   BUN/Creatinine Ratio 14 10 - 24   Sodium 140 134 - 144 mmol/L   Potassium 4.6 3.5 - 5.2 mmol/L   Chloride 102 96 - 106 mmol/L   CO2 24 20 - 29 mmol/L   Calcium 9.2 8.6 - 10.2 mg/dL   Total Protein 7.0 6.0 - 8.5 g/dL   Albumin 4.4 3.8 - 4.9 g/dL   Globulin, Total 2.6 1.5 - 4.5 g/dL   Albumin/Globulin Ratio 1.7 1.2 - 2.2   Bilirubin Total 0.4 0.0 - 1.2 mg/dL   Alkaline Phosphatase 40 (L) 44 - 121 IU/L   AST 21 0 - 40 IU/L   ALT 15 0 - 44 IU/L  Thyroid Panel With TSH  Result Value Ref Range   TSH 1.450 0.450 - 4.500 uIU/mL   T4, Total 6.5 4.5 - 12.0 ug/dL   T3 Uptake Ratio 27 24 - 39 %   Free Thyroxine Index 1.8 1.2 - 4.9  VITAMIN D 25 Hydroxy (Vit-D Deficiency, Fractures)  Result Value Ref Range   Vit D, 25-Hydroxy 38.3 30.0 - 100.0 ng/mL  Vitamin B12  Result Value Ref Range   Vitamin B-12 762 232 - 1,245 pg/mL       05/31/2022    3:46 PM 06/20/2021    9:34 AM 09/20/2020   11:18 AM 12/03/2019    3:17 PM 12/23/2018    2:53 PM  Depression screen PHQ 2/9  Decreased Interest 0 0 0 0 0  Down, Depressed, Hopeless 0 0 0 0 0  PHQ - 2 Score 0 0 0 0 0  Altered sleeping 0 0     Tired, decreased energy 0 0     Change in appetite 0 0     Feeling bad or failure about yourself  0 0     Trouble concentrating 0 0     Moving slowly or fidgety/restless 1 0     Suicidal thoughts 0 0     PHQ-9 Score 1 0     Difficult doing work/chores Not difficult at all Not difficult at all          05/31/2022     3:47 PM 06/20/2021    9:35 AM  GAD 7 : Generalized Anxiety Score  Nervous, Anxious, on Edge 0 1  Control/stop worrying 0 0  Worry too much - different things 0 0  Trouble relaxing 1 1  Restless 0 0  Easily annoyed or irritable 1 2  Afraid - awful might happen 0 0  Total GAD 7 Score 2 4  Anxiety Difficulty Not difficult at all Not difficult at all         Pertinent labs & imaging results that were available during my care of the patient were reviewed by me and considered in my medical decision making.  Assessment & Plan:  Cloyde Shanklin" was  seen today for referral.  Diagnoses and all orders for this visit:  Skin abnormalities Referral placed as below for evaluation and recommendations from Dermatology.  Will start medication as below.  -     Ambulatory referral to Dermatology -     triamcinolone (KENALOG) 0.025 % ointment; Apply 1 Application topically 2 (two) times daily.  Arthralgia, unspecified joint Refill as below.  Discussed with patient to start OTC PPI to protect GI tract.  Patient verbalized that he would start OTC PPI  -     ibuprofen (ADVIL) 800 MG tablet; Take 1 tablet (800 mg total) by mouth every 8 (eight) hours as needed.    Continue all other maintenance medications.  Follow up plan: Return if symptoms worsen or fail to improve.  Continue healthy lifestyle choices, including diet (rich in fruits, vegetables, and lean proteins, and low in salt and simple carbohydrates) and exercise (at least 30 minutes of moderate physical activity daily).  Written and verbal instructions provided   The above assessment and management plan was discussed with the patient. The patient verbalized understanding of and has agreed to the management plan. Patient is aware to call the clinic if they develop any new symptoms or if symptoms persist or worsen. Patient is aware when to return to the clinic for a follow-up visit. Patient educated on when it is appropriate to go to  the emergency department.   Neale Burly, DNP-FNP [facility] [address] [place], [place] [address] [phone]

## 2023-01-04 NOTE — Addendum Note (Signed)
Addended by: Neale Burly on: 01/04/2023 04:45 PM   Modules accepted: Orders

## 2023-03-19 ENCOUNTER — Other Ambulatory Visit: Payer: Self-pay | Admitting: Family Medicine

## 2023-03-19 DIAGNOSIS — M2550 Pain in unspecified joint: Secondary | ICD-10-CM

## 2023-06-18 ENCOUNTER — Ambulatory Visit: Payer: Self-pay | Admitting: Nurse Practitioner

## 2023-06-18 NOTE — Telephone Encounter (Signed)
Copied from CRM [id number]. Topic:  Chief Complaint: back pain Symptoms: back pain Frequency: constant Pertinent Negatives: Patient denies numbness/tingling, weakness, loss of bowel/bladder Disposition: [] ED /[] Urgent Care (no appt availability in office) / [x] Appointment(In office/virtual)/ []  [facility]/ [] Home Care/ [x] Refused Recommended Disposition /[] [facility]/ []  Follow-up with PCP Additional Notes: Pt reports 8/10 back pain. Pt states he was coughing all night with flu-like symptoms and when he awoke this AM, he was in severe back pain. States he threw his back out while coughing. Pt also endorses congestion, abdominal cramping that has resolved, body aches, and a fever of 101F last night. Pt has a hx of back pain. Pt states he took a Tylenol and it hasn't yet been effective. Pt states he is hunched over d/t pain. Pt states he can still walk but "it takes a while." Denies hematuria/dysuria. Denies numbness, tingling, weakness. Denies loss of bowel/bladder control. RN advised pt he should be seen today. No availability at his PCP office. RN looked at availability of other offices closes to patient's home, but patient said he is alone right now and doesn't have a way to get to an appt. RN offered pt a few appt times but he said none of them would work because he has no ride. Pt states he will have his daughter bring him to UC this afternoon around 4-5pm when she returns from work. Pt states he does not think he can drive right now, RN advised pt don't drive. Pt said he is going to try lying down because that has helped before. RN advised pt that if he becomes weak or dizzy, or has numbness/tingling in the legs, or if his abdominal pain returns or he has N/V, or any worsening, he should call 911 and go to the ED in that case. Pt verbalized understanding.  Clinical - Red Word Triage >> Jun 18, 2023  9:10 AM Carlatta H wrote: Red Word that prompted transfer to Nurse Triage:  Patient has had coughing and congestion for about 1 week. Last night he was cough so much he threw his back out//It is very painful for him to walk,move and talk// Reason for Disposition  [1] Fever AND [2] no symptoms of UTI  (Exception: Has generalized muscle pains, not localized back pain.)  Answer Assessment - Initial Assessment Questions 1. ONSET: "When did the pain begin?"      Last night while coughing - states hx of back pain. "I didn't know it until I got up this AM." 2. LOCATION: "Where does it hurt?" (upper, mid or lower back)     Right lower back 3. SEVERITY: "How bad is the pain?"  (e.g., Scale 1-10; mild, moderate, or severe)   - MILD (1-3): Doesn't interfere with normal activities.    - MODERATE (4-7): Interferes with normal activities or awakens from sleep.    - SEVERE (8-10): Excruciating pain, unable to do any normal activities.      8-9/10 - states he can walk "it just takes a while" 4. PATTERN: "Is the pain constant?" (e.g., yes, no; constant, intermittent)      Constant 5. RADIATION: "Does the pain shoot into your legs or somewhere else?"     "I don't know, it just hurts." "It shoots at times." 6. CAUSE:  "What do you think is causing the back pain?"      "Threw back out while coughing" 7. BACK OVERUSE:  "Any recent lifting of heavy objects, strenuous work or exercise?"  None 8. MEDICINES: "What have you taken so far for the pain?" (e.g., nothing, acetaminophen, NSAIDS)     Tylenol - not effective 9. NEUROLOGIC SYMPTOMS: "Do you have any weakness, numbness, or problems with bowel/bladder control?"     No 10. OTHER SYMPTOMS: "Do you have any other symptoms?" (e.g., fever, abdomen pain, burning with urination, blood in urine)       No numbness/tingling in the legs. Denies hematuria and dysuria. "Last night I think I had a fever, it could have been the flu." Flu-like symptoms since Friday or Saturday. "Aches and pains and a fever/flushed-feeling, I didn't know a fever  until last night (101F)." "It's just a cough, nothing is coming up." Endorses abdominal cramping that is better today. Denies N/V/D. States pt is hunched over right now d/t pain. Coughed on and on and on last night.  Protocols used: Back Pain-A-AH

## 2023-06-19 ENCOUNTER — Other Ambulatory Visit: Payer: Self-pay | Admitting: Nurse Practitioner

## 2023-06-19 DIAGNOSIS — M2550 Pain in unspecified joint: Secondary | ICD-10-CM

## 2023-06-27 ENCOUNTER — Encounter: Payer: Self-pay | Admitting: Dermatology

## 2023-06-27 ENCOUNTER — Ambulatory Visit: Payer: BC Managed Care – PPO | Admitting: Dermatology

## 2023-06-27 VITALS — BP 103/67

## 2023-06-27 DIAGNOSIS — W908XXA Exposure to other nonionizing radiation, initial encounter: Secondary | ICD-10-CM | POA: Diagnosis not present

## 2023-06-27 DIAGNOSIS — Z808 Family history of malignant neoplasm of other organs or systems: Secondary | ICD-10-CM

## 2023-06-27 DIAGNOSIS — L570 Actinic keratosis: Secondary | ICD-10-CM

## 2023-06-27 DIAGNOSIS — L578 Other skin changes due to chronic exposure to nonionizing radiation: Secondary | ICD-10-CM | POA: Diagnosis not present

## 2023-06-27 NOTE — Patient Instructions (Signed)

## 2023-06-27 NOTE — Progress Notes (Signed)
   New Patient Visit   Subjective  Christopher Vance is a 63 y.o. male who presents for the following: He has some scaly itchy spots of face for over a year. His father has a history of skin cancer.    The following portions of the chart were reviewed this encounter and updated as appropriate: medications, allergies, medical history  Review of Systems:  No other skin or systemic complaints except as noted in HPI or Assessment and Plan.  Objective  Well appearing patient in no apparent distress; mood and affect are within normal limits.   A focused examination was performed of the following areas: Face   Relevant exam findings are noted in the Assessment and Plan.  Face, scalp Erythematous thin papules/macules with gritty scale.   Assessment & Plan   Actinic Keratoses Assessment: Patient presents with multiple scaly, crusty spots on the face and scalp, clinically consistent with actinic keratoses. These lesions are described as precancerous, with abnormal cells visible under microscopic examination. The condition is attributed to cumulative sun exposure over time. A history of previous skin lesion removal via surgery approximately one year ago is noted.  Plan:   Cryotherapy with liquid nitrogen for lesions on the scalp.   Application of Aquaphor Healing Ointment twice daily to treated areas.   Follow-up appointment in 2 months for full body skin cancer screening.   Annual dermatology visits for ongoing monitoring and management.   Patient education provided on sun protection and skin cancer prevention.  ACTINIC DAMAGE - chronic, secondary to cumulative UV radiation exposure/sun exposure over time - diffuse scaly erythematous macules with underlying dyspigmentation - Recommend daily broad spectrum sunscreen SPF 30+ to sun-exposed areas, reapply every 2 hours as needed.  - Recommend staying in the shade or wearing long sleeves, sun glasses (UVA+UVB protection) and wide brim hats  (4-inch brim around the entire circumference of the hat). - Call for new or changing lesions.    AK (ACTINIC KERATOSIS) Face, scalp Scalp treated with LN2 today. We will plan to treat face on follow up. Destruction of lesion - Face, scalp Complexity: simple   Destruction method: cryotherapy   Informed consent: discussed and consent obtained   Timeout:  patient name, date of birth, surgical site, and procedure verified Lesion destroyed using liquid nitrogen: Yes   Region frozen until ice ball extended beyond lesion: Yes   Outcome: patient tolerated procedure well with no complications   Post-procedure details: wound care instructions given    Return in about 2 months (around 08/25/2023) for AK follow up, TBSE.  I, Joanie Coddington, CMA, am acting as scribe for [employer], DO .   Documentation: I have reviewed the above documentation for accuracy and completeness, and I agree with the above.  Langston Reusing, DO

## 2023-07-01 ENCOUNTER — Other Ambulatory Visit: Payer: Self-pay | Admitting: Nurse Practitioner

## 2023-07-01 DIAGNOSIS — M2550 Pain in unspecified joint: Secondary | ICD-10-CM

## 2023-07-01 MED ORDER — IBUPROFEN 800 MG PO TABS
800.0000 mg | ORAL_TABLET | Freq: Two times a day (BID) | ORAL | 0 refills | Status: DC | PRN
Start: 2023-07-01 — End: 2023-07-01

## 2023-07-01 MED ORDER — IBUPROFEN 800 MG PO TABS
800.0000 mg | ORAL_TABLET | Freq: Two times a day (BID) | ORAL | 0 refills | Status: DC | PRN
Start: 2023-07-01 — End: 2023-09-30

## 2023-07-01 NOTE — Telephone Encounter (Unsigned)
 Copied from CRM [id number]. Topic: Clinical - Medication Question >> Jul 01, 2023 10:21 AM Gildardo Pounds wrote: Reason for CRM: Rich with [employer] is calling about ibuprofen (ADVIL) 800 MG tablet. Patient also has a prescription of Meloxicam. Are you aware patient has both NSAIDs? Callback number is [phone] ref # [id number]

## 2023-08-29 ENCOUNTER — Encounter: Payer: Self-pay | Admitting: Dermatology

## 2023-08-29 ENCOUNTER — Ambulatory Visit: Payer: BC Managed Care – PPO | Admitting: Dermatology

## 2023-08-29 VITALS — BP 105/69 | HR 66

## 2023-08-29 DIAGNOSIS — L821 Other seborrheic keratosis: Secondary | ICD-10-CM

## 2023-08-29 DIAGNOSIS — W908XXA Exposure to other nonionizing radiation, initial encounter: Secondary | ICD-10-CM | POA: Diagnosis not present

## 2023-08-29 DIAGNOSIS — Z808 Family history of malignant neoplasm of other organs or systems: Secondary | ICD-10-CM

## 2023-08-29 DIAGNOSIS — L814 Other melanin hyperpigmentation: Secondary | ICD-10-CM | POA: Diagnosis not present

## 2023-08-29 DIAGNOSIS — D1801 Hemangioma of skin and subcutaneous tissue: Secondary | ICD-10-CM

## 2023-08-29 DIAGNOSIS — L570 Actinic keratosis: Secondary | ICD-10-CM | POA: Diagnosis not present

## 2023-08-29 DIAGNOSIS — L219 Seborrheic dermatitis, unspecified: Secondary | ICD-10-CM

## 2023-08-29 DIAGNOSIS — L578 Other skin changes due to chronic exposure to nonionizing radiation: Secondary | ICD-10-CM

## 2023-08-29 DIAGNOSIS — Z1283 Encounter for screening for malignant neoplasm of skin: Secondary | ICD-10-CM

## 2023-08-29 DIAGNOSIS — D229 Melanocytic nevi, unspecified: Secondary | ICD-10-CM

## 2023-08-29 NOTE — Patient Instructions (Addendum)

## 2023-08-29 NOTE — Progress Notes (Unsigned)
 Total Body Skin Exam (TBSE) Visit   Subjective  Christopher Vance is a 63 y.o. male who presents for the following: Skin Cancer Screening and Full Body Skin Exam  Patient presents today for follow up visit for TBSE. Patient was last evaluated on 06/26/22 . Patient denies medication changes. Patient reports he  does have spots, moles and lesions of concern to be evaluated located on the scalp. Patient reports throughout his lifetime he  has had moderate sun exposure. Currently, patient reports if he  has excessive sun exposure, he  does apply sunscreen and/or wears protective coverings. Patient reports he  does not have hx of bx. Patient reports  family history of skin cancers (Dad). The patient has spots, moles and lesions to be evaluated, some may be new or changing and the patient has concerns that these could be cancer.  The following portions of the chart were reviewed this encounter and updated as appropriate: medications, allergies, medical history  Review of Systems:  No other skin or systemic complaints except as noted in HPI or Assessment and Plan.  Objective  Well appearing patient in no apparent distress; mood and affect are within normal limits.  A full examination was performed including scalp, head, eyes, ears, nose, lips, neck, chest, axillae, abdomen, back, buttocks, bilateral upper extremities, bilateral lower extremities, hands, feet, fingers, toes, fingernails, and toenails. All findings within normal limits unless otherwise noted below.   Relevant physical exam findings are noted in the Assessment and Plan.    Assessment & Plan   LENTIGINES, SEBORRHEIC KERATOSES, HEMANGIOMAS - Benign normal skin lesions - Benign-appearing - Call for any changes  MELANOCYTIC NEVI - Tan-brown and/or pink-flesh-colored symmetric macules and papules - Benign appearing on exam today - Observation - Call clinic for new or changing moles - Recommend daily use of broad spectrum spf 30+  sunscreen to sun-exposed areas.   MILD ACTINIC DAMAGE - Chronic condition, secondary to cumulative UV/sun exposure - diffuse scaly erythematous macules with underlying dyspigmentation - Recommend daily broad spectrum sunscreen SPF 30+ to sun-exposed areas, reapply every 2 hours as needed.  - Staying in the shade or wearing long sleeves, sun glasses (UVA+UVB protection) and wide brim hats (4-inch brim around the entire circumference of the hat) are also recommended for sun protection.  - Call for new or changing lesions.  ACTINIC KERATOSIS Exam: Erythematous thin papules/macules with gritty scale at the scalp  Actinic keratoses are precancerous spots that appear secondary to cumulative UV radiation exposure/sun exposure over time. They are chronic with expected duration over 1 year. A portion of actinic keratoses will progress to squamous cell carcinoma of the skin. It is not possible to reliably predict which spots will progress to skin cancer and so treatment is recommended to prevent development of skin cancer.  Recommend daily broad spectrum sunscreen SPF 30+ to sun-exposed areas, reapply every 2 hours as needed.  Recommend staying in the shade or wearing long sleeves, sun glasses (UVA+UVB protection) and wide brim hats (4-inch brim around the entire circumference of the hat). Call for new or changing lesions.  Treatment Plan: - Cryo Therapy Completed while in office today  SEBORRHEIC DERMATITIS Exam: Pink patches with greasy scale at scalp  Flared  Seborrheic Dermatitis is a chronic persistent rash characterized by pinkness and scaling most commonly of the mid face but also can occur on the scalp (dandruff), ears; mid chest, mid back and groin.  It tends to be exacerbated by stress and cooler weather.  People who have neurologic disease may experience new onset or exacerbation of existing seborrheic dermatitis.  The condition is not curable but treatable and can be  controlled.  Treatment Plan: - Recommended washing daily with CeraVe Zinc Shampoo and Vichy Sillneum Shampoo  SKIN CANCER SCREENING PERFORMED TODAY.    Return in about 1 year (around 08/28/2024) for TBSE.  I, Jetta Ager, am acting as Neurosurgeon for [employer], DO.   Documentation: I have reviewed the above documentation for accuracy and completeness, and I agree with the above.  Louana Roup, DO

## 2023-09-30 ENCOUNTER — Other Ambulatory Visit: Payer: Self-pay | Admitting: Nurse Practitioner

## 2023-09-30 DIAGNOSIS — M2550 Pain in unspecified joint: Secondary | ICD-10-CM

## 2023-10-30 ENCOUNTER — Ambulatory Visit: Payer: Self-pay

## 2023-10-30 ENCOUNTER — Telehealth (INDEPENDENT_AMBULATORY_CARE_PROVIDER_SITE_OTHER): Admitting: Nurse Practitioner

## 2023-10-30 DIAGNOSIS — R051 Acute cough: Secondary | ICD-10-CM

## 2023-10-30 DIAGNOSIS — R2241 Localized swelling, mass and lump, right lower limb: Secondary | ICD-10-CM

## 2023-10-30 DIAGNOSIS — R0602 Shortness of breath: Secondary | ICD-10-CM

## 2023-10-30 NOTE — Telephone Encounter (Signed)
 Video visit with Christopher Vance today (10/30/2023)

## 2023-10-30 NOTE — Progress Notes (Incomplete)
 Virtual Visit via video Note Due to COVID-19 pandemic this visit was conducted virtually. This visit type was conducted due to national recommendations for restrictions regarding the COVID-19 Pandemic (e.g. social distancing, sheltering in place) in an effort to limit this patient's exposure and mitigate transmission in our community. All issues noted in this document were discussed and addressed.  A physical exam was not performed with this format.   I connected with Christopher Vance on 10/30/2023 at 0915 by name  and DOB and verified that I am speaking with the correct person using two identifiers. Christopher Vance is currently located at home and 10 is currently with them during visit. The provider, Nena Deitra Morton Sebastian, DNP is located in their office at time of visit.  I discussed the limitations, risks, security and privacy concerns of performing an evaluation and management service by virtual visit and the availability of in person appointments. I also discussed with the patient that there may be a patient responsible charge related to this service. The patient expressed understanding and agreed to proceed.  Subjective:  Patient ID: Christopher Vance, male    DOB: [birth year], 62 y.o.   MRN: [id number]  Chief Complaint:  No chief complaint on file.  HPI: Christopher Vance is a 63 y.o. male presenting on 10/30/2023 for No chief complaint on file.     Relevant past medical, surgical, family, and social history reviewed and updated as indicated.  Allergies and medications reviewed and updated.   Past Medical History:  Diagnosis Date   Acne rosacea    DDD (degenerative disc disease), cervical    Hyperlipidemia    Multiple sclerosis (HCC)    Multiple sclerosis (HCC)    RLS (restless legs syndrome)     Past Surgical History:  Procedure Laterality Date   NO PAST SURGERIES      Social History   Socioeconomic History   Marital status: Widowed    Spouse name: Not on file   Number of  children: Not on file   Years of education: Not on file   Highest education level: 12th grade  Occupational History   Not on file  Tobacco Use   Smoking status: Never    Passive exposure: Never   Smokeless tobacco: Never  Vaping Use   Vaping status: Never Used  Substance and Sexual Activity   Alcohol use: No   Drug use: No   Sexual activity: Not on file  Other Topics Concern   Not on file  Social History Narrative   Pt lives with his daughter, one story home. Completed 12th grade.    Social Drivers of Corporate investment banker Strain: Low Risk  (10/30/2023)   Overall Financial Resource Strain (CARDIA)    Difficulty of Paying Living Expenses: Not hard at all  Food Insecurity: No Food Insecurity (10/30/2023)   Hunger Vital Sign    Worried About Running Out of Food in the Last Year: Never true    Ran Out of Food in the Last Year: Never true  Transportation Needs: No Transportation Needs (10/30/2023)   PRAPARE - Administrator, Civil Service (Medical): No    Lack of Transportation (Non-Medical): No  Physical Activity: Sufficiently Active (10/30/2023)   Exercise Vital Sign    Days of Exercise per Week: 5 days    Minutes of Exercise per Session: 60 min  Stress: No Stress Concern Present (10/30/2023)   [employer] of Occupational Health - Occupational Stress Questionnaire  Feeling of Stress: Only a little  Social Connections: Moderately Integrated (10/30/2023)   Social Connection and Isolation Panel    Frequency of Communication with Friends and Family: Twice a week    Frequency of Social Gatherings with Friends and Family: Once a week    Attends Religious Services: 1 to 4 times per year    Active Member of [employer] or Organizations: Yes    Attends Banker Meetings: 1 to 4 times per year    Marital Status: Widowed  Intimate Partner Violence: Not on file    Outpatient Encounter Medications as of 10/30/2023  Medication Sig   doxycycline  (VIBRA -TABS) 100 MG  tablet Take 1 tablet (100 mg total) by mouth 2 (two) times daily. 1 po bid   ibuprofen  (ADVIL ) 800 MG tablet Take 1 tablet (800 mg total) by mouth 2 (two) times daily as needed. **NEEDS TO BE SEEN BEFORE NEXT REFILL**   rizatriptan  (MAXALT -MLT) 10 MG disintegrating tablet Take 1 tablet (10 mg total) by mouth as needed for migraine. May repeat in 2 hours if needed   triamcinolone  (KENALOG ) 0.025 % ointment Apply 1 Application topically 2 (two) times daily.   No facility-administered encounter medications on file as of 10/30/2023.    Allergies  Allergen Reactions   Levofloxacin Nausea And Vomiting    Review of Systems       Observations/Objective: No vital signs or physical exam, this was a virtual health encounter.  Pt alert and oriented, answers all questions appropriately, and able to speak in full sentences.    Assessment and Plan: Diagnoses and all orders for this visit:  Acute cough -     Cancel: DG Chest 2 View; Future  SOB (shortness of breath) -     Cancel: DG Chest 2 View; Future     Follow Up Instructions: No follow-ups on file.    I discussed the assessment and treatment plan with the patient. The patient was provided an opportunity to ask questions and all were answered. The patient agreed with the plan and demonstrated an understanding of the instructions.   The patient was advised to call back or seek an in-person evaluation if the symptoms worsen or if the condition fails to improve as anticipated.  The above assessment and management plan was discussed with the patient. The patient verbalized understanding of and has agreed to the management plan. Patient is aware to call the clinic if they develop any new symptoms or if symptoms persist or worsen. Patient is aware when to return to the clinic for a follow-up visit. Patient educated on when it is appropriate to go to the emergency department.    I provided 10 minutes of time during this video  encounter.   Crewe Heathman [place] Thompson, DNP [facility] [address] [place], [place] [address] [phone] 10/30/2023

## 2023-10-30 NOTE — Telephone Encounter (Signed)
 FYI Only or Action Required?: FYI only for provider.  Patient was last seen in primary care on 01/04/2023 by Cathlene Marry Lenis, FNP. Called Nurse Triage reporting Leg Pain. Symptoms began today. Interventions attempted: Nothing. Symptoms are: unchanged.  Triage Disposition: See Physician Within 24 Hours  Patient/caregiver understands and will follow disposition?: Yes            Copied from CRM [id number]. Topic: Clinical - Red Word Triage >> Oct 30, 2023  8:18 AM Elle L wrote: Red Word that prompted transfer to Nurse Triage: The patient states he is experiencing pain from a knot on his tibia that is spreading to his calf. Reason for Disposition  [1] Swelling is painful to touch AND [2] no fever  Answer Assessment - Initial Assessment Questions 1. APPEARANCE of SWELLING: What does it look like?     Knot or lump 2. SIZE: How large is the swelling? (e.g., inches, cm; or compare to size of pinhead, tip of pen, eraser, coin, pea, grape, ping pong ball)      2-3 inches wide and 4 inches long 3. LOCATION: Where is the swelling located?     Right tibia and calf 4. ONSET: When did the swelling start?     This morning 5. COLOR: What color is it? Is there more than one color?     Normal skin color 6. PAIN: Is there any pain? If Yes, ask: How bad is the pain? (e.g., scale 1-10; or mild, moderate, severe)     - NONE (0): no pain   - MILD (1-3): doesn't interfere with normal activities    - MODERATE (4-7): interferes with normal activities or awakens from sleep    - SEVERE (8-10): excruciating pain, unable to do any normal activities     Tender to touch 7. ITCH: Does it itch? If Yes, ask: How bad is the itch?      no 8. CAUSE: What do you think caused the swelling? 9 OTHER SYMPTOMS: Do you have any other symptoms? (e.g., fever)     pain  Protocols used: Skin Lump or Localized Swelling-A-AH

## 2023-11-04 ENCOUNTER — Encounter: Payer: Self-pay | Admitting: Nurse Practitioner

## 2023-11-06 DIAGNOSIS — R2241 Localized swelling, mass and lump, right lower limb: Secondary | ICD-10-CM | POA: Insufficient documentation

## 2023-11-06 NOTE — Progress Notes (Signed)
 Virtual Visit via video Note Due to COVID-19 pandemic this visit was conducted virtually. This visit type was conducted due to national recommendations for restrictions regarding the COVID-19 Pandemic (e.g. social distancing, sheltering in place) in an effort to limit this patient's exposure and mitigate transmission in our community. All issues noted in this document were discussed and addressed.  A physical exam was not performed with this format.   I connected with Christopher Vance on 10/30/2023 at 0900 by name and date of birth and verified that I am speaking with the correct person using two identifiers. Christopher Vance is currently located at home during visit. The provider, Nena Cassis Morton Hummer, DNP is located [employer] courtyard in [place]  at time of visit.  I discussed the limitations, risks, security and privacy concerns of performing an evaluation and management service by virtual visit and the availability of in person appointments. I also discussed with the patient that there may be a patient responsible charge related to this service. The patient expressed understanding and agreed to proceed.  Subjective:  Patient ID: Christopher Vance, male    DOB: November 13, 1960, 63 y.o.   MRN: [id number]  Chief Complaint:  lump on lleft leg   HPI: Christopher Vance is a 63 y.o. male presenting on 10/30/2023 for lump on lleft leg  Christopher Vance is a 63 year old male presenting via telehealth on 10/30/2023 with concerns about a lump on his left leg. He reports the presence of a tender, raised area approximately 2 inches wide and long located on the inner aspect of his right tibia, describing it as "a goose head on my tibia." The lump is tender to touch but has shown some improvement since onset. He denies bruising, redness, or recent trauma. He does report a prior history of shin splints in the same area. No signs of systemic symptoms such as fever or chills are reported.   Relevant past  medical, surgical, family, and social history reviewed and updated as indicated.  Allergies and medications reviewed and updated.   Past Medical History:  Diagnosis Date   Acne rosacea    DDD (degenerative disc disease), cervical    Hyperlipidemia    Multiple sclerosis (HCC)    Multiple sclerosis (HCC)    RLS (restless legs syndrome)     Past Surgical History:  Procedure Laterality Date   NO PAST SURGERIES      Social History   Socioeconomic History   Marital status: Widowed    Spouse name: Not on file   Number of children: Not on file   Years of education: Not on file   Highest education level: 12th grade  Occupational History   Not on file  Tobacco Use   Smoking status: Never    Passive exposure: Never   Smokeless tobacco: Never  Vaping Use   Vaping status: Never Used  Substance and Sexual Activity   Alcohol use: No   Drug use: No   Sexual activity: Not on file  Other Topics Concern   Not on file  Social History Narrative   Pt lives with his daughter, one story home. Completed 12th grade.    Social Drivers of Corporate investment banker Strain: Low Risk  (10/30/2023)   Overall Financial Resource Strain (CARDIA)    Difficulty of Paying Living Expenses: Not hard at all  Food Insecurity: No Food Insecurity (10/30/2023)   Hunger Vital Sign    Worried About Running Out of Food  in the Last Year: Never true    Ran Out of Food in the Last Year: Never true  Transportation Needs: No Transportation Needs (10/30/2023)   PRAPARE - Administrator, Civil Service (Medical): No    Lack of Transportation (Non-Medical): No  Physical Activity: Sufficiently Active (10/30/2023)   Exercise Vital Sign    Days of Exercise per Week: 5 days    Minutes of Exercise per Session: 60 min  Stress: No Stress Concern Present (10/30/2023)   [employer] of Occupational Health - Occupational Stress Questionnaire    Feeling of Stress: Only a little  Social Connections: Moderately  Integrated (10/30/2023)   Social Connection and Isolation Panel    Frequency of Communication with Friends and Family: Twice a week    Frequency of Social Gatherings with Friends and Family: Once a week    Attends Religious Services: 1 to 4 times per year    Active Member of [employer] or Organizations: Yes    Attends Banker Meetings: 1 to 4 times per year    Marital Status: Widowed  Intimate Partner Violence: Not on file    Outpatient Encounter Medications as of 10/30/2023  Medication Sig   doxycycline  (VIBRA -TABS) 100 MG tablet Take 1 tablet (100 mg total) by mouth 2 (two) times daily. 1 po bid   ibuprofen  (ADVIL ) 800 MG tablet Take 1 tablet (800 mg total) by mouth 2 (two) times daily as needed. **NEEDS TO BE SEEN BEFORE NEXT REFILL**   rizatriptan  (MAXALT -MLT) 10 MG disintegrating tablet Take 1 tablet (10 mg total) by mouth as needed for migraine. May repeat in 2 hours if needed   triamcinolone  (KENALOG ) 0.025 % ointment Apply 1 Application topically 2 (two) times daily.   No facility-administered encounter medications on file as of 10/30/2023.    Allergies  Allergen Reactions   Levofloxacin Nausea And Vomiting    Review of Systems  Constitutional:  Negative for activity change and appetite change.  HENT:  Negative for congestion and sinus pain.   Respiratory:  Negative for chest tightness, shortness of breath and wheezing.   Cardiovascular:  Negative for chest pain and leg swelling.  Gastrointestinal:  Negative for blood in stool, diarrhea, nausea and vomiting.  Skin:        Lump on right and left eye  Neurological:  Negative for dizziness and facial asymmetry.     Observations/Objective: No vital signs or physical exam, this was a virtual health encounter.  Pt alert and oriented, answers all questions appropriately, and able to speak in full sentences.   Physical Exam Constitutional:      General: He is not in acute distress. HENT:     Head: Normocephalic and  atraumatic.  Eyes:     Extraocular Movements: Extraocular movements intact.     Conjunctiva/sclera: Conjunctivae normal.     Pupils: Pupils are equal, round, and reactive to light.  Pulmonary:     Effort: Pulmonary effort is normal.  Neurological:     Mental Status: He is alert and oriented to person, place, and time.  Psychiatric:        Mood and Affect: Mood normal.        Behavior: Behavior normal.        Judgment: Judgment normal.     Assessment and Plan: Christopher Vance was seen today for lump on lleft leg.  Diagnoses and all orders for this visit:  Lump of thigh, right   Christopher Vance is a 63 year old Caucasian male  seen today via telehealth, no acute distress  -Conservative Management (if no red flag symptoms): Cold compresses (15-20 minutes, 2-3 times daily) for the next 24-48 hours if any swelling remains. -Transition to warm compresses after 48 hours to promote reabsorption if the lump persists but is improving. -Limit high-impact activities  to prevent further irritation, especially given history of shin splints.  -Elevate the leg when resting to reduce localized swelling.  -Monitor for changes in size, color, pain, or drainage.  Watch for red flag signs: increasing pain, redness, warmth, spreading swelling, fever, or inability to bear weight.   The above assessment and management plan was discussed with the patient. The patient verbalized understanding of and has agreed to the management plan. Patient is aware to call the clinic if they develop any new symptoms or if symptoms persist or worsen. Patient is aware when to return to the clinic for a follow-up visit. Patient educated on when it is appropriate to go to the emergency department.  Follow Up Instructions: Return if symptoms worsen or fail to improve.    I discussed the assessment and treatment plan with the patient. The patient was provided an opportunity to ask questions and all were answered. The patient agreed  with the plan and demonstrated an understanding of the instructions.   The patient was advised to call back or seek an in-person evaluation if the symptoms worsen or if the condition fails to improve as anticipated.  The above assessment and management plan was discussed with the patient. The patient verbalized understanding of and has agreed to the management plan. Patient is aware to call the clinic if they develop any new symptoms or if symptoms persist or worsen. Patient is aware when to return to the clinic for a follow-up visit. Patient educated on when it is appropriate to go to the emergency department.    I provided 12 minutes of time during this video encounter.   Nena Cassis Morton Hummer, [place] [facility] [address] [place], [place] [address] [phone] 10/30/2023 Continue healthy lifestyle choices, including diet (rich in fruits, vegetables, and lean proteins, and low in salt and simple carbohydrates) and exercise (at least 30 minutes of moderate physical activity daily).     The above assessment and management plan was discussed with the patient. The patient verbalized understanding of and has agreed to the management plan. Patient is aware to call the clinic if they develop any new symptoms or if symptoms persist or worsen. Patient is aware when to return to the clinic for a follow-up visit. Patient educated on when it is appropriate to go to the emergency department.

## 2024-08-31 ENCOUNTER — Ambulatory Visit: Admitting: Dermatology
